# Patient Record
Sex: Female | Born: 1970 | Race: White | Hispanic: No | Marital: Married | Smoking: Never smoker
Health system: Southern US, Community
[De-identification: ages and names within clinical notes are randomized; demographics above are authoritative.]

## PROBLEM LIST (undated history)

## (undated) DIAGNOSIS — F329 Major depressive disorder, single episode, unspecified: Secondary | ICD-10-CM

## (undated) DIAGNOSIS — E079 Disorder of thyroid, unspecified: Secondary | ICD-10-CM

## (undated) DIAGNOSIS — E039 Hypothyroidism, unspecified: Secondary | ICD-10-CM

## (undated) DIAGNOSIS — I10 Essential (primary) hypertension: Secondary | ICD-10-CM

## (undated) DIAGNOSIS — E8881 Metabolic syndrome: Secondary | ICD-10-CM

## (undated) DIAGNOSIS — F419 Anxiety disorder, unspecified: Secondary | ICD-10-CM

## (undated) DIAGNOSIS — F32A Depression, unspecified: Secondary | ICD-10-CM

## (undated) HISTORY — DX: Metabolic syndrome: E88.810

## (undated) HISTORY — DX: Anxiety disorder, unspecified: F41.9

## (undated) HISTORY — DX: Metabolic syndrome: E88.81

## (undated) HISTORY — PX: WISDOM TOOTH EXTRACTION: SHX21

---

## 1999-11-03 ENCOUNTER — Other Ambulatory Visit: Admission: RE | Admit: 1999-11-03 | Discharge: 1999-11-03 | Payer: Self-pay | Admitting: Obstetrics and Gynecology

## 2000-11-25 ENCOUNTER — Emergency Department (HOSPITAL_COMMUNITY): Admission: EM | Admit: 2000-11-25 | Discharge: 2000-11-25 | Payer: Self-pay | Admitting: Emergency Medicine

## 2001-01-12 ENCOUNTER — Other Ambulatory Visit: Admission: RE | Admit: 2001-01-12 | Discharge: 2001-01-12 | Payer: Self-pay | Admitting: Obstetrics and Gynecology

## 2001-03-01 HISTORY — PX: NASAL SINUS SURGERY: SHX719

## 2001-06-16 ENCOUNTER — Encounter: Payer: Self-pay | Admitting: Otolaryngology

## 2001-06-16 ENCOUNTER — Ambulatory Visit (HOSPITAL_COMMUNITY): Admission: RE | Admit: 2001-06-16 | Discharge: 2001-06-16 | Payer: Self-pay | Admitting: Otolaryngology

## 2001-07-11 ENCOUNTER — Encounter: Payer: Self-pay | Admitting: Otolaryngology

## 2001-07-11 ENCOUNTER — Ambulatory Visit (HOSPITAL_COMMUNITY): Admission: RE | Admit: 2001-07-11 | Discharge: 2001-07-11 | Payer: Self-pay | Admitting: Otolaryngology

## 2001-08-01 ENCOUNTER — Ambulatory Visit (HOSPITAL_COMMUNITY): Admission: RE | Admit: 2001-08-01 | Discharge: 2001-08-01 | Payer: Self-pay | Admitting: Otolaryngology

## 2001-08-01 ENCOUNTER — Encounter: Payer: Self-pay | Admitting: Otolaryngology

## 2002-02-01 ENCOUNTER — Other Ambulatory Visit: Admission: RE | Admit: 2002-02-01 | Discharge: 2002-02-01 | Payer: Self-pay | Admitting: Obstetrics and Gynecology

## 2002-06-14 IMAGING — CT CT MAXILLOFACIAL W/O CM
1 series · 15 of 30 positions shown, 19 images · non-contrast
Comparison: none

FINDINGS
CLINICAL DATA: CHRONIC SINUSITIS.
CT MAXILLOFACIAL WITHOUT CONTRAST
THE PATIENT'S PRIOR EXAM WAS PERFORMED 07/11/01.
THERE IS PERSISTENT PROMINENT POLYPOID OPACIFICATION OF THE LEFT MAXILLARY SINUS, WHICH MAY
REPRESENT A RETENTION CYST VERSUS POLYP.  THE LOWER ASPECT OF THE INFUNDIBULUM OF THE LEFT
OSTEOMEATAL COMPLEX IS SLIGHTLY NARROWED BY A HALLER CELL.  THE LEFT-SIDED HALLER CELL IS PARTIALLY
OPACIFIED WITH A SMALL AIR-FLUID LEVEL.
MUCOSAL THICKENING AND NARROWING OF THE INFUNDIBULUM OF THE RIGHT OSTEOMEATAL COMPLEX.  MILD
MUCOSAL THICKENING, RIGHT MAXILLARY SINUS, MOST  NOTABLE ALONG THE MEDIAL AND INFERIOR WALL
MEASURING UP TO 4 MM IN MAXIMAL MUCOSAL THICKNESS.
NASAL SEPTUM IS MINIMALLY DEVIATED TO THE RIGHT.  THERE IS A MILD PARADOXICAL CONFIGURATION OF THE
RIGHT MIDDLE TURBINATE.
ASYMMETRIC SPHENOID SINUS AIR CELL WITH RIGHT SPHENOID SINUS AIR CELL DOMINANT.
HYPOPLASTIC LEFT FRONTAL SINUS.  FRONTAL SINUSES ARE CLEAR AS ARE THE MAJORITY OF THE ETHMOID SINUS
AIR CELLS WITH THE EXCEPTION OF A SINGLE LEFT MID ETHMOID SINUS AIR CELL AND THE ABOVE DESCRIBED
LEFT HALLER CELL.
IMPRESSION
POLYPOID OPACIFICATION LEFT MAXILLARY SINUS POSSIBLY REPRESENTING A RETENTION CYST VS. POLYP.
THERE IS NARROWING OF THE INFUNDIBULUM LEFT OSTEOMEATAL COMPLEX ALONG ITS LOWER ASPECT CAUSED BY
HALLER CELL.  LEFT-SIDED HALLER CELL IS PARTIALLY OPACIFIED WITH A SMALL AIR-FLUID LEVEL.
MUCOSAL THICKENING WITH SUBSEQUENT NARROWING OF THE INFUNDIBULUM AND THE RIGHT OSTEOMEATAL COMPLEX.
 MILD MUCOSAL THICKENING, RIGHT MAXILLARY SINUS, MOST NOTABLE MEDIALLY AND INFERIORLY MEASURING UP
TO 4 MM IN MAXIMAL THICKNESS.
ASYMMETRIC APPEARANCE OF THE SPHENOID SINUS AIR CELLS WITH THE RIGHT SPHENOID SINUS AIR CELL
DOMINANT.
MILD DEVIATION OF THE NASAL SEPTUM TO THE RIGHT WITH A PARADOXICAL CONFIGURATION OF THE RIGHT
MIDDLE TURBINATE.

[Series 6291: — · axial · 0.28mm/px · z∈[-574,-494]mm · 15 of 36 slices shown, 19 images]
[im 2/36  brain]
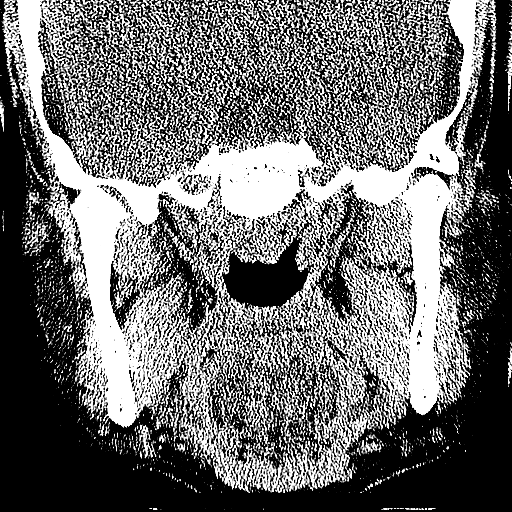
[im 2/36  bone]
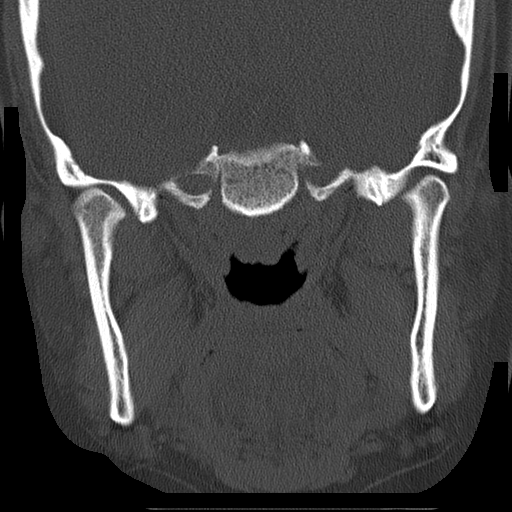
[im 4/36  bone]
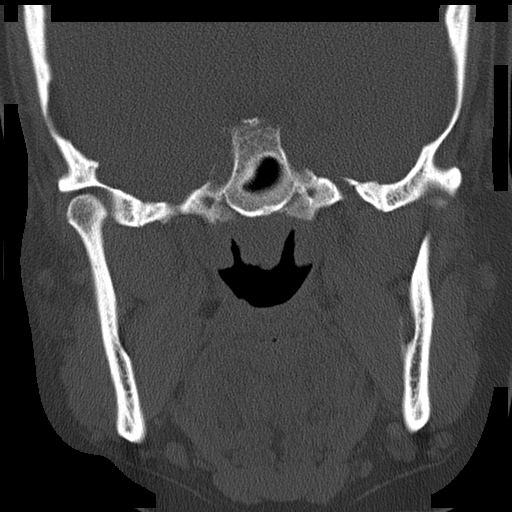
[im 7/36  bone]
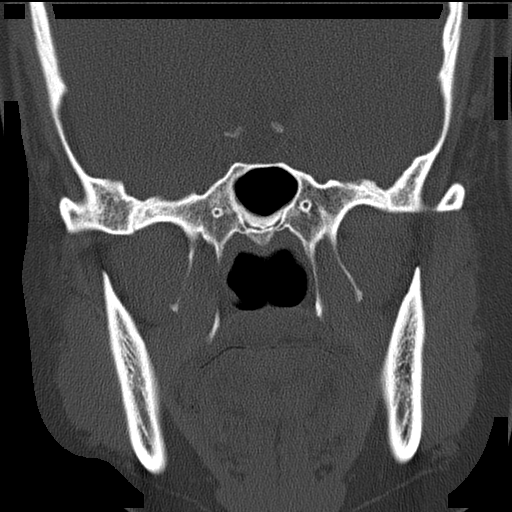
[im 9/36  bone]
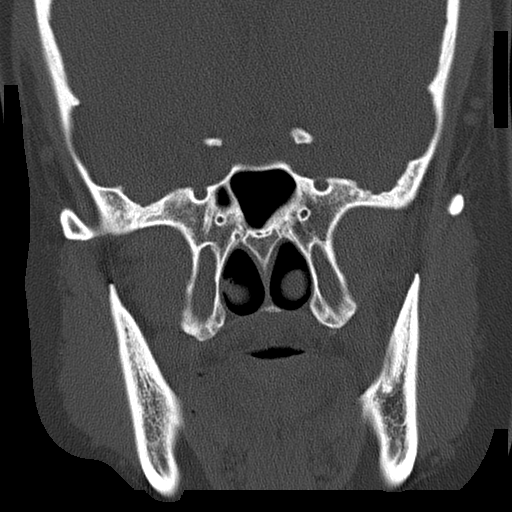
[im 11/36  brain]
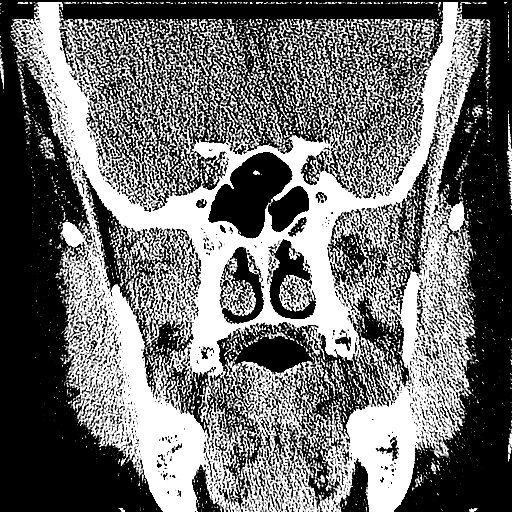
[im 11/36  bone]
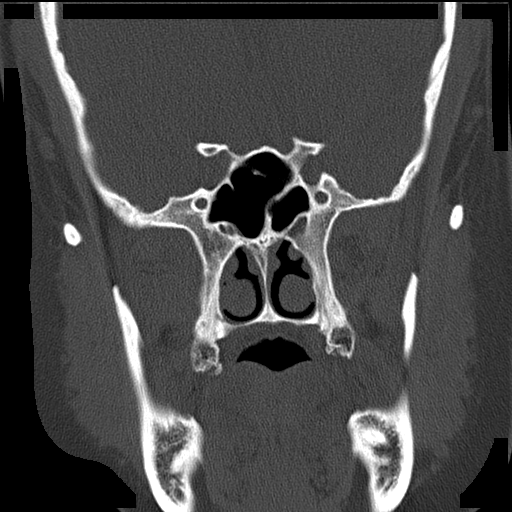
[im 14/36  bone]
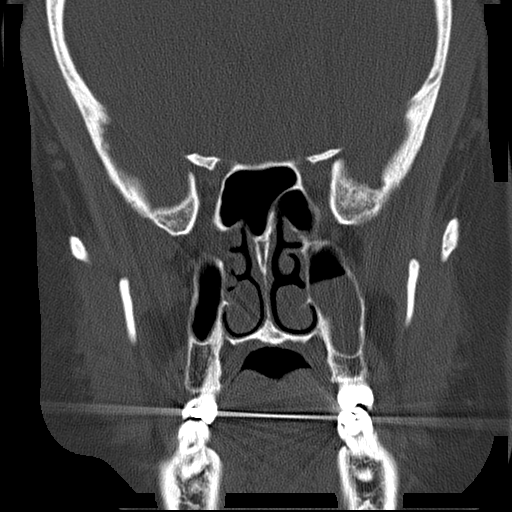
[im 16/36  bone]
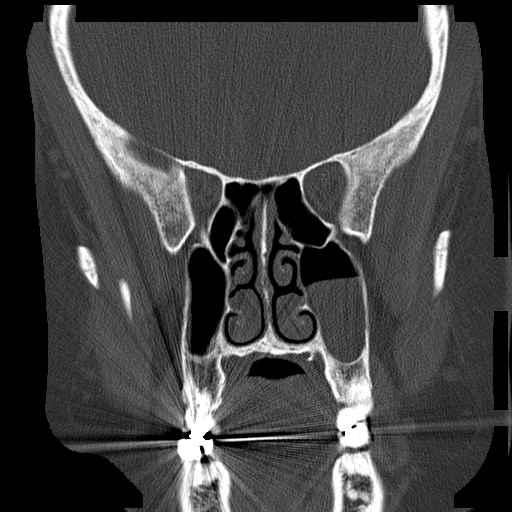
[im 19/36  bone]
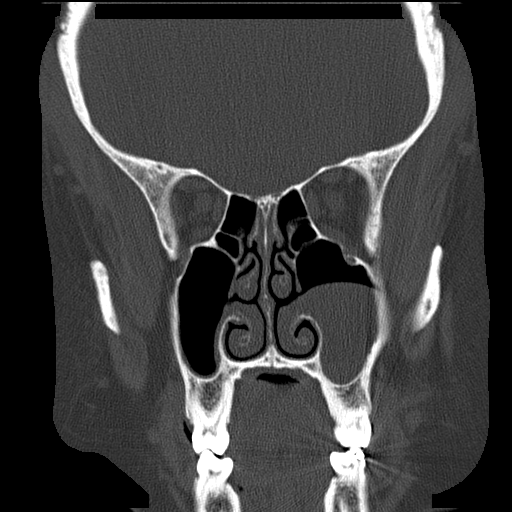
[im 20/36  brain]
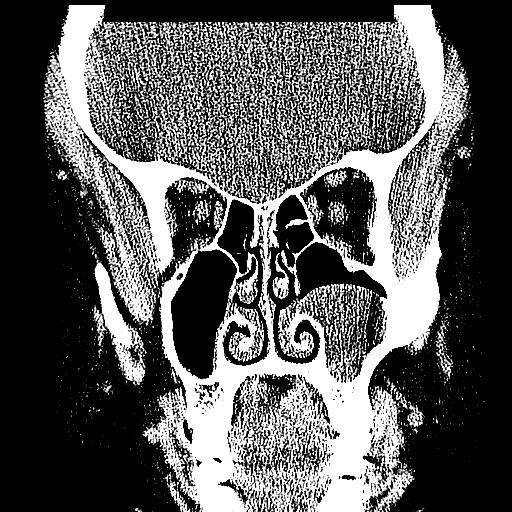
[im 20/36  bone]
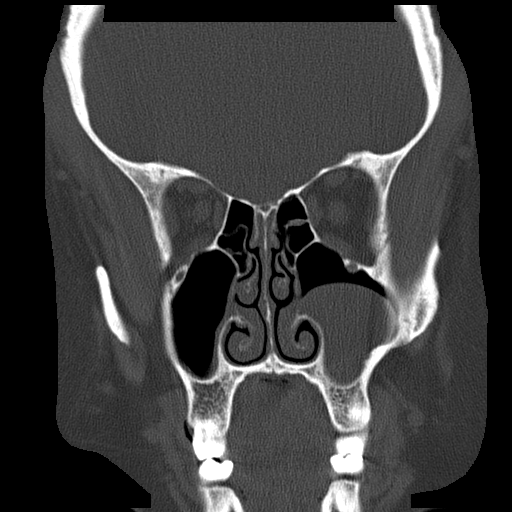
[im 22/36  bone]
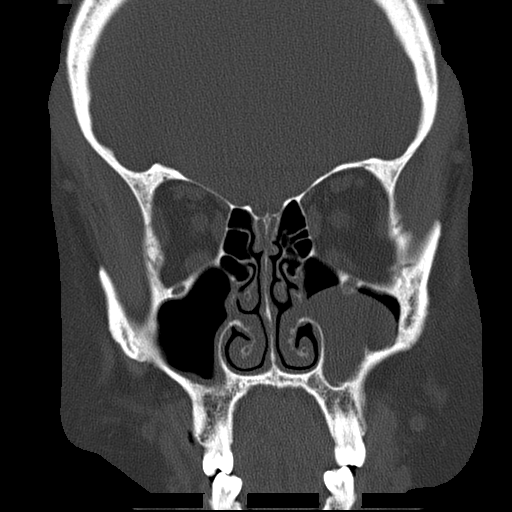
[im 25/36  bone]
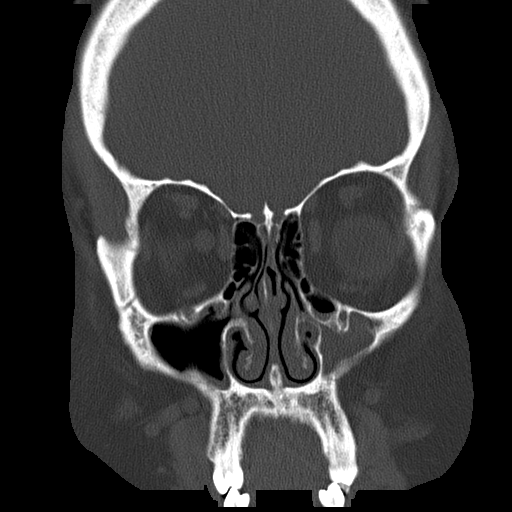
[im 27/36  bone]
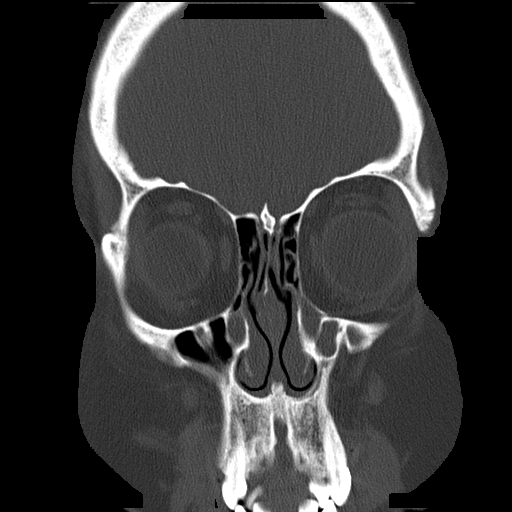
[im 29/36  brain]
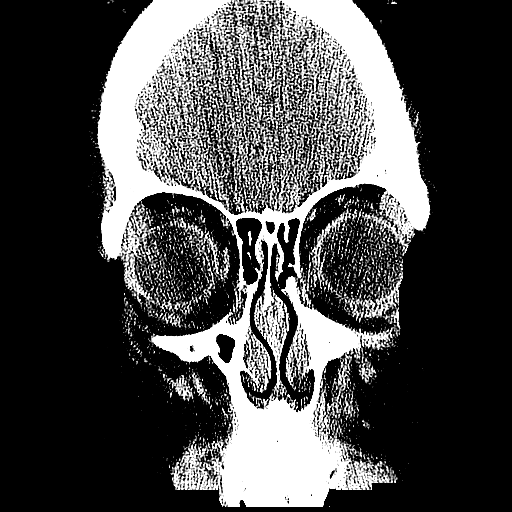
[im 29/36  bone]
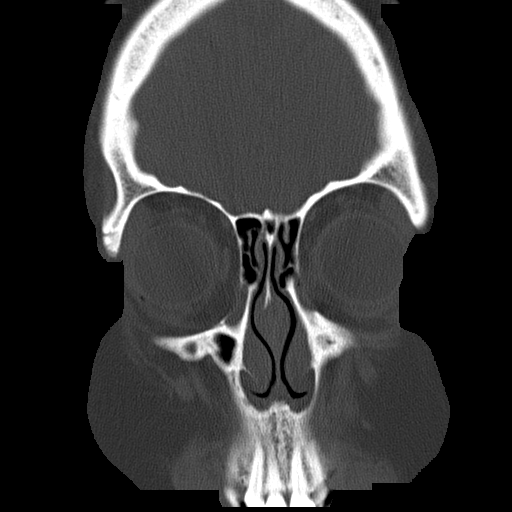
[im 32/36  bone]
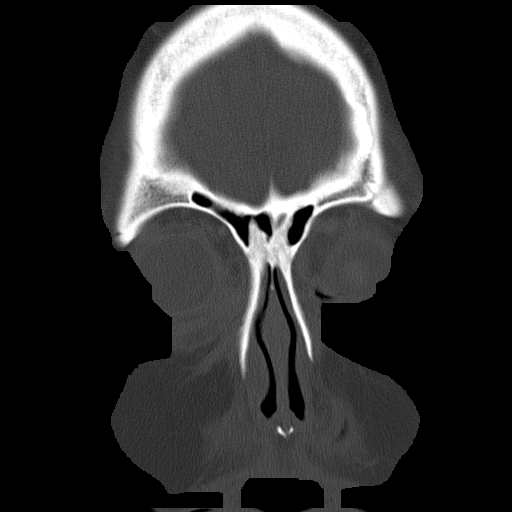
[im 34/36  bone]
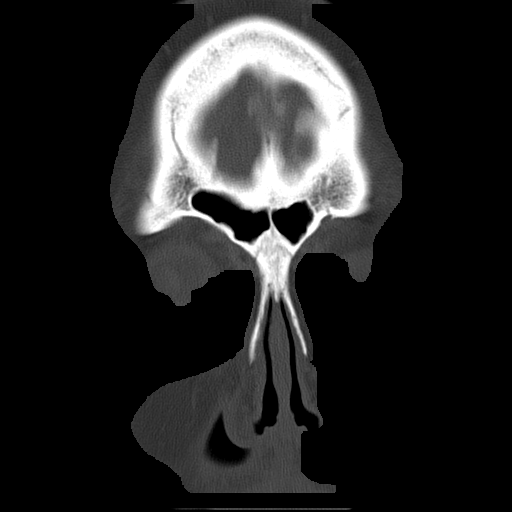

[15 of 30 positions shown; findings below may reference images not displayed]

## 2003-01-06 ENCOUNTER — Emergency Department (HOSPITAL_COMMUNITY): Admission: EM | Admit: 2003-01-06 | Discharge: 2003-01-06 | Payer: Self-pay | Admitting: Emergency Medicine

## 2003-02-13 ENCOUNTER — Other Ambulatory Visit: Admission: RE | Admit: 2003-02-13 | Discharge: 2003-02-13 | Payer: Self-pay | Admitting: Obstetrics and Gynecology

## 2004-03-30 ENCOUNTER — Other Ambulatory Visit: Admission: RE | Admit: 2004-03-30 | Discharge: 2004-03-30 | Payer: Self-pay | Admitting: Obstetrics and Gynecology

## 2004-05-05 ENCOUNTER — Ambulatory Visit (HOSPITAL_COMMUNITY): Admission: RE | Admit: 2004-05-05 | Discharge: 2004-05-05 | Payer: Self-pay | Admitting: Family Medicine

## 2005-04-27 ENCOUNTER — Other Ambulatory Visit: Admission: RE | Admit: 2005-04-27 | Discharge: 2005-04-27 | Payer: Self-pay | Admitting: Obstetrics and Gynecology

## 2006-03-31 ENCOUNTER — Inpatient Hospital Stay (HOSPITAL_COMMUNITY): Admission: AD | Admit: 2006-03-31 | Discharge: 2006-03-31 | Payer: Self-pay | Admitting: Obstetrics and Gynecology

## 2006-04-22 ENCOUNTER — Ambulatory Visit (HOSPITAL_COMMUNITY): Admission: RE | Admit: 2006-04-22 | Discharge: 2006-04-22 | Payer: Self-pay | Admitting: Obstetrics and Gynecology

## 2006-04-29 ENCOUNTER — Inpatient Hospital Stay (HOSPITAL_COMMUNITY): Admission: AD | Admit: 2006-04-29 | Discharge: 2006-04-29 | Payer: Self-pay | Admitting: Obstetrics and Gynecology

## 2007-06-23 ENCOUNTER — Inpatient Hospital Stay (HOSPITAL_COMMUNITY): Admission: AD | Admit: 2007-06-23 | Discharge: 2007-06-25 | Payer: Self-pay | Admitting: Obstetrics and Gynecology

## 2007-06-23 ENCOUNTER — Encounter (INDEPENDENT_AMBULATORY_CARE_PROVIDER_SITE_OTHER): Payer: Self-pay | Admitting: Obstetrics and Gynecology

## 2007-06-29 ENCOUNTER — Ambulatory Visit: Admission: RE | Admit: 2007-06-29 | Discharge: 2007-06-29 | Payer: Self-pay | Admitting: Obstetrics and Gynecology

## 2008-10-11 HISTORY — PX: US ECHOCARDIOGRAPHY: HXRAD669

## 2008-10-11 HISTORY — PX: NM MYOCAR PERF WALL MOTION: HXRAD629

## 2009-03-01 HISTORY — PX: DILATION AND CURETTAGE OF UTERUS: SHX78

## 2010-11-24 LAB — CBC
HCT: 27.7 — ABNORMAL LOW
HCT: 34.3 — ABNORMAL LOW
Hemoglobin: 12.3
Hemoglobin: 9.9 — ABNORMAL LOW
MCHC: 35.6
MCHC: 35.7
MCV: 87
MCV: 87.7
Platelets: 224
Platelets: 262
RBC: 3.16 — ABNORMAL LOW
RBC: 3.94
RDW: 14.7
RDW: 14.7
WBC: 12.3 — ABNORMAL HIGH
WBC: 9.2

## 2010-11-24 LAB — RPR: RPR Ser Ql: NONREACTIVE

## 2011-08-10 ENCOUNTER — Encounter (HOSPITAL_COMMUNITY): Payer: Self-pay

## 2011-08-10 ENCOUNTER — Emergency Department (HOSPITAL_COMMUNITY)
Admission: EM | Admit: 2011-08-10 | Discharge: 2011-08-10 | Disposition: A | Discharge status: Home / Self Care | Payer: No Typology Code available for payment source | Attending: Emergency Medicine | Admitting: Emergency Medicine

## 2011-08-10 DIAGNOSIS — Z79899 Other long term (current) drug therapy: Secondary | ICD-10-CM | POA: Insufficient documentation

## 2011-08-10 DIAGNOSIS — S161XXA Strain of muscle, fascia and tendon at neck level, initial encounter: Secondary | ICD-10-CM

## 2011-08-10 DIAGNOSIS — E079 Disorder of thyroid, unspecified: Secondary | ICD-10-CM | POA: Insufficient documentation

## 2011-08-10 DIAGNOSIS — M542 Cervicalgia: Secondary | ICD-10-CM | POA: Insufficient documentation

## 2011-08-10 DIAGNOSIS — Z7982 Long term (current) use of aspirin: Secondary | ICD-10-CM | POA: Insufficient documentation

## 2011-08-10 DIAGNOSIS — I10 Essential (primary) hypertension: Secondary | ICD-10-CM | POA: Insufficient documentation

## 2011-08-10 DIAGNOSIS — M546 Pain in thoracic spine: Secondary | ICD-10-CM | POA: Insufficient documentation

## 2011-08-10 HISTORY — DX: Essential (primary) hypertension: I10

## 2011-08-10 HISTORY — DX: Disorder of thyroid, unspecified: E07.9

## 2011-08-10 MED ORDER — METHOCARBAMOL 500 MG PO TABS: ORAL_TABLET | ORAL | Status: AC

## 2011-08-10 MED ORDER — IBUPROFEN 200 MG PO TABS
600.0000 mg | ORAL_TABLET | Freq: Once | ORAL | Status: AC
  Administered 2011-08-10: 600 mg via ORAL
  Filled 2011-08-10: qty 3

## 2011-08-10 MED ORDER — IBUPROFEN 600 MG PO TABS: 600.0000 mg | ORAL_TABLET | Freq: Four times a day (QID) | ORAL | Status: AC | PRN
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
## 2011-09-05 ENCOUNTER — Encounter (HOSPITAL_COMMUNITY): Payer: Self-pay | Admitting: Pharmacist

## 2011-09-06 NOTE — H&P (Signed)
Brandi Young  DICTATION # [id number] CSN# [id number]   Meriel Pica, MD 09/06/2011 9:48 AM

## 2011-09-06 NOTE — H&P (Signed)
[name], Brandi Young                ACCOUNT NO.:  [id number]  MEDICAL RECORD NO.:  [id number]  LOCATION:                                 FACILITY:  PHYSICIAN:  Duke Salvia. Marcelle Overlie, M.D.DATE OF BIRTH:  1970-03-13  DATE OF ADMISSION:  09/16/2011 DATE OF DISCHARGE:                             HISTORY & PHYSICAL   CHIEF COMPLAINT:  Dyspareunia, pelvic pain, symptomatic uterine prolapse.  HISTORY OF PRESENT ILLNESS:  A 41 year old, G1, P1, who has a 1-year history of increasing pelvic pressure, the sensation that something is falling out and discomfort with intercourse.  On recent examination, it was noted to have moderate uterine prolapse.  There was no significant cystocele or rectocele noted.  The remainder of her exam was unremarkable.  Last GYN ultrasound in November 2012.  She had a normal size uterus.  Adnexa unremarkable.  She presents at this time for LAVH. This procedure including risks related to bleeding, infection, adjacent organ injury, the possible need to complete the surgery by open technique along with her expected recovery time reviewed, which she understands and accepts.  ALLERGIES:  None.  CURRENT MEDICATIONS:  Norvasc, Toprol-XL, Synthroid, Cymbalta, metformin, baby aspirin daily.  OBSTETRICAL HISTORY:  One vaginal delivery in 2009.  She did have a D and C in 2011 to evaluate abnormal bleeding.  REVIEW OF SYSTEMS:  Significant for history of headache, thyroid disease, and hypertension.  FAMILY HISTORY:  Significant for heart disease, IBS, thyroid disease, kidney disease, diabetes, and hypertension.  SOCIAL HISTORY:  Denies alcohol, tobacco, or drug use.  Dr. Regino Schultze is her medical doctor.  PHYSICAL EXAMINATION:  VITAL SIGNS:  Temperature 98.2, blood pressure 132/90. HEENT:  Unremarkable. NECK:  Supple without masses. LUNGS:  Clear. CARDIOVASCULAR:  Regular rate and rhythm without murmurs, rubs, or gallops.  BREASTS:  Without masses. ABDOMEN:  Soft,  flat, nontender. PELVIC:  Normal external genitalia.  Vagina and cervix revealed the cervix halfway down the vagina, consistent with uterine prolapse.  Her anterior and posterior support was reasonably normal.  Uterus normal size.  Adnexa negative. EXTREMITIES:  Unremarkable. NEUROLOGIC:  Unremarkable.  IMPRESSION:  Symptomatic uterine prolapse.  PLAN:  LAVH.  Procedure and risks discussed as above.     Brandi Young M. Marcelle Overlie, M.D.     [facility]MED[facility]  D:  09/06/2011  T:  09/06/2011  Job:  [id number]

## 2011-09-08 ENCOUNTER — Encounter (HOSPITAL_COMMUNITY)
Admission: RE | Admit: 2011-09-08 | Discharge: 2011-09-08 | Disposition: A | Discharge status: Home / Self Care | Payer: BC Managed Care – PPO | Source: Ambulatory Visit | Attending: Obstetrics and Gynecology | Admitting: Obstetrics and Gynecology

## 2011-09-08 ENCOUNTER — Encounter (HOSPITAL_COMMUNITY): Payer: Self-pay

## 2011-09-08 HISTORY — DX: Major depressive disorder, single episode, unspecified: F32.9

## 2011-09-08 HISTORY — DX: Depression, unspecified: F32.A

## 2011-09-08 LAB — CBC
HCT: 38.5 % (ref 36.0–46.0)
Hemoglobin: 13.2 g/dL (ref 12.0–15.0)
MCH: 28.9 pg (ref 26.0–34.0)
MCHC: 34.3 g/dL (ref 30.0–36.0)
MCV: 84.4 fL (ref 78.0–100.0)
Platelets: 282 10*3/uL (ref 150–400)
RBC: 4.56 MIL/uL (ref 3.87–5.11)
RDW: 13.6 % (ref 11.5–15.5)
WBC: 7.6 10*3/uL (ref 4.0–10.5)

## 2011-09-08 LAB — BASIC METABOLIC PANEL
BUN: 13 mg/dL (ref 6–23)
CO2: 29 mEq/L (ref 19–32)
Calcium: 9.9 mg/dL (ref 8.4–10.5)
Chloride: 102 mEq/L (ref 96–112)
Creatinine, Ser: 0.68 mg/dL (ref 0.50–1.10)
GFR calc Af Amer: >90 mL/min (ref >90)
GFR calc non Af Amer: >90 mL/min (ref >90)
Glucose, Bld: 85 mg/dL (ref 70–99)
Potassium: 4 mEq/L (ref 3.5–5.1)
Sodium: 139 mEq/L (ref 135–145)

## 2011-09-08 LAB — SURGICAL PCR SCREEN
MRSA, PCR: INVALID — AB
Staphylococcus aureus: INVALID — AB

## 2011-09-08 NOTE — Patient Instructions (Signed)
  Your procedure is scheduled on: Thursday, July 18  Enter through the [employer] [facility] at: 6:00am Pick up the phone at the desk and dial [phone] and inform us of your arrival.  Please call this number if you have any problems the morning of surgery: [phone]  Remember: Do not eat food after midnight: Wednesday Do not drink clear liquids after: Wednesday Take these medicines the morning of surgery with a SIP OF WATER:  Norvasc Cymbalta Synthroid Toprol   Do not wear jewelry, make-up, or FINGER nail polish No metal in your hair or on your body. Do not wear lotions, powders, perfumes or deodorant. Do not shave 48 hours prior to surgery. Do not bring valuables to the hospital. Contacts, dentures or bridgework may not be worn into surgery.  Leave suitcase in the car. After Surgery it may be brought to your room. For patients being admitted to the hospital, checkout time is 11:00am the day of discharge.  Remember to use your hibiclens as instructed.Please shower with 1/2 bottle the evening before your surgery and the other 1/2 bottle the morning of surgery. Neck down avoiding private area.

## 2011-09-08 NOTE — Pre-Procedure Instructions (Signed)
EKG new and old shown to Dr. Malen Gauze.  No new orders received.

## 2011-09-10 LAB — MRSA CULTURE

## 2011-09-16 ENCOUNTER — Ambulatory Visit (HOSPITAL_COMMUNITY)
Admission: RE | Admit: 2011-09-16 | Discharge: 2011-09-17 | Disposition: A | Discharge status: Home / Self Care | Payer: BC Managed Care – PPO | Source: Ambulatory Visit | Attending: Obstetrics and Gynecology | Admitting: Obstetrics and Gynecology

## 2011-09-16 ENCOUNTER — Encounter (HOSPITAL_COMMUNITY): Payer: Self-pay | Admitting: Anesthesiology

## 2011-09-16 ENCOUNTER — Encounter (HOSPITAL_COMMUNITY): Payer: Self-pay | Admitting: *Deleted

## 2011-09-16 ENCOUNTER — Ambulatory Visit (HOSPITAL_COMMUNITY): Payer: BC Managed Care – PPO | Admitting: Anesthesiology

## 2011-09-16 ENCOUNTER — Encounter (HOSPITAL_COMMUNITY)
Admission: RE | Disposition: A | Discharge status: Home / Self Care | Payer: Self-pay | Source: Ambulatory Visit | Attending: Obstetrics and Gynecology

## 2011-09-16 DIAGNOSIS — Z5331 Laparoscopic surgical procedure converted to open procedure: Secondary | ICD-10-CM | POA: Insufficient documentation

## 2011-09-16 DIAGNOSIS — N814 Uterovaginal prolapse, unspecified: Secondary | ICD-10-CM | POA: Insufficient documentation

## 2011-09-16 DIAGNOSIS — N949 Unspecified condition associated with female genital organs and menstrual cycle: Secondary | ICD-10-CM | POA: Insufficient documentation

## 2011-09-16 DIAGNOSIS — IMO0002 Reserved for concepts with insufficient information to code with codable children: Secondary | ICD-10-CM | POA: Insufficient documentation

## 2011-09-16 HISTORY — DX: Hypothyroidism, unspecified: E03.9

## 2011-09-16 HISTORY — PX: VAGINAL HYSTERECTOMY: SHX2639

## 2011-09-16 LAB — URINALYSIS, ROUTINE W REFLEX MICROSCOPIC
Bilirubin Urine: NEGATIVE
Glucose, UA: NEGATIVE mg/dL
Ketones, ur: NEGATIVE mg/dL
Leukocytes, UA: NEGATIVE
Nitrite: NEGATIVE
Protein, ur: NEGATIVE mg/dL
Specific Gravity, Urine: 1.02 (ref 1.005–1.030)
Urobilinogen, UA: 0.2 mg/dL (ref 0.0–1.0)
pH: 6.5 (ref 5.0–8.0)

## 2011-09-16 LAB — URINE MICROSCOPIC-ADD ON

## 2011-09-16 LAB — PREGNANCY, URINE: Preg Test, Ur: NEGATIVE

## 2011-09-16 SURGERY — HYSTERECTOMY, VAGINAL
Anesthesia: General | Site: Vagina | Wound class: Clean Contaminated

## 2011-09-16 MED ORDER — DIPHENHYDRAMINE HCL 12.5 MG/5ML PO ELIX: 12.5000 mg | ORAL_SOLUTION | Freq: Four times a day (QID) | ORAL | Status: DC | PRN

## 2011-09-16 MED ORDER — BUTORPHANOL TARTRATE 1 MG/ML IJ SOLN: 1.0000 mg | INTRAMUSCULAR | Status: DC | PRN

## 2011-09-16 MED ORDER — NEOSTIGMINE METHYLSULFATE 1 MG/ML IJ SOLN
INTRAMUSCULAR | Status: DC | PRN
  Administered 2011-09-16: 3 mg via INTRAVENOUS

## 2011-09-16 MED ORDER — ONDANSETRON HCL 4 MG/2ML IJ SOLN
INTRAMUSCULAR | Status: AC
  Filled 2011-09-16: qty 2

## 2011-09-16 MED ORDER — MIDAZOLAM HCL 2 MG/2ML IJ SOLN
INTRAMUSCULAR | Status: AC
  Filled 2011-09-16: qty 2

## 2011-09-16 MED ORDER — LIDOCAINE HCL (CARDIAC) 20 MG/ML IV SOLN
INTRAVENOUS | Status: DC | PRN
  Administered 2011-09-16: 50 mg via INTRAVENOUS

## 2011-09-16 MED ORDER — KETOROLAC TROMETHAMINE 30 MG/ML IJ SOLN: 15.0000 mg | Freq: Once | INTRAMUSCULAR | Status: DC | PRN

## 2011-09-16 MED ORDER — GLYCOPYRROLATE 0.2 MG/ML IJ SOLN
INTRAMUSCULAR | Status: DC | PRN
  Administered 2011-09-16: 0.1 mg via INTRAVENOUS
  Administered 2011-09-16: 0.6 mg via INTRAVENOUS

## 2011-09-16 MED ORDER — FENTANYL CITRATE 0.05 MG/ML IJ SOLN
INTRAMUSCULAR | Status: AC
  Filled 2011-09-16: qty 5

## 2011-09-16 MED ORDER — ONDANSETRON HCL 4 MG PO TABS: 4.0000 mg | ORAL_TABLET | Freq: Four times a day (QID) | ORAL | Status: DC | PRN

## 2011-09-16 MED ORDER — ONDANSETRON HCL 4 MG/2ML IJ SOLN: 4.0000 mg | Freq: Four times a day (QID) | INTRAMUSCULAR | Status: DC | PRN

## 2011-09-16 MED ORDER — METOPROLOL SUCCINATE ER 100 MG PO TB24
100.0000 mg | ORAL_TABLET | Freq: Every day | ORAL | Status: DC
  Administered 2011-09-17: 100 mg via ORAL
  Filled 2011-09-16: qty 1

## 2011-09-16 MED ORDER — DIPHENHYDRAMINE HCL 12.5 MG/5ML PO ELIX
12.5000 mg | ORAL_SOLUTION | Freq: Four times a day (QID) | ORAL | Status: DC | PRN
  Filled 2011-09-16: qty 5

## 2011-09-16 MED ORDER — ZOLPIDEM TARTRATE 5 MG PO TABS: 5.0000 mg | ORAL_TABLET | Freq: Every evening | ORAL | Status: DC | PRN

## 2011-09-16 MED ORDER — KETOROLAC TROMETHAMINE 30 MG/ML IJ SOLN
INTRAMUSCULAR | Status: DC | PRN
  Administered 2011-09-16: 30 mg via INTRAVENOUS

## 2011-09-16 MED ORDER — SODIUM CHLORIDE 0.9 % IJ SOLN
INTRAMUSCULAR | Status: DC | PRN
  Administered 2011-09-16: 10 mL

## 2011-09-16 MED ORDER — SODIUM CHLORIDE 0.9 % IJ SOLN: 9.0000 mL | INTRAMUSCULAR | Status: DC | PRN

## 2011-09-16 MED ORDER — MIDAZOLAM HCL 5 MG/5ML IJ SOLN
INTRAMUSCULAR | Status: DC | PRN
  Administered 2011-09-16: 2 mg via INTRAVENOUS

## 2011-09-16 MED ORDER — PROPOFOL 10 MG/ML IV EMUL
INTRAVENOUS | Status: AC
Start: 2011-09-16 — End: 2011-09-16
  Filled 2011-09-16: qty 20

## 2011-09-16 MED ORDER — OXYCODONE-ACETAMINOPHEN 5-325 MG PO TABS: 1.0000 | ORAL_TABLET | ORAL | Status: DC | PRN

## 2011-09-16 MED ORDER — LEVOTHYROXINE SODIUM 175 MCG PO TABS
175.0000 ug | ORAL_TABLET | Freq: Every day | ORAL | Status: DC
  Administered 2011-09-17: 175 ug via ORAL
  Filled 2011-09-16: qty 1

## 2011-09-16 MED ORDER — DIPHENHYDRAMINE HCL 50 MG/ML IJ SOLN: 12.5000 mg | Freq: Four times a day (QID) | INTRAMUSCULAR | Status: DC | PRN

## 2011-09-16 MED ORDER — FENTANYL CITRATE 0.05 MG/ML IJ SOLN
INTRAMUSCULAR | Status: DC | PRN
  Administered 2011-09-16: 50 ug via INTRAVENOUS
  Administered 2011-09-16: 100 ug via INTRAVENOUS
  Administered 2011-09-16: 150 ug via INTRAVENOUS

## 2011-09-16 MED ORDER — DEXAMETHASONE SODIUM PHOSPHATE 10 MG/ML IJ SOLN
INTRAMUSCULAR | Status: AC
  Filled 2011-09-16: qty 1

## 2011-09-16 MED ORDER — AMLODIPINE BESYLATE 2.5 MG PO TABS
2.5000 mg | ORAL_TABLET | Freq: Every day | ORAL | Status: DC
  Administered 2011-09-17: 09:00:00 via ORAL
  Filled 2011-09-16: qty 1

## 2011-09-16 MED ORDER — LEVOTHYROXINE SODIUM 150 MCG PO TABS: 150.0000 ug | ORAL_TABLET | Freq: Every day | ORAL | Status: DC

## 2011-09-16 MED ORDER — 0.9 % SODIUM CHLORIDE (POUR BTL) OPTIME
TOPICAL | Status: DC | PRN
  Administered 2011-09-16: 1000 mL

## 2011-09-16 MED ORDER — MORPHINE SULFATE (PF) 1 MG/ML IV SOLN
INTRAVENOUS | Status: DC
  Administered 2011-09-16: 6.5 mg via INTRAVENOUS
  Administered 2011-09-16: 9 mg via INTRAVENOUS

## 2011-09-16 MED ORDER — IBUPROFEN 800 MG PO TABS
800.0000 mg | ORAL_TABLET | Freq: Three times a day (TID) | ORAL | Status: DC | PRN
  Administered 2011-09-17: 800 mg via ORAL
  Filled 2011-09-16: qty 1

## 2011-09-16 MED ORDER — BUPIVACAINE HCL (PF) 0.25 % IJ SOLN
INTRAMUSCULAR | Status: DC | PRN
  Administered 2011-09-16: 7 mL

## 2011-09-16 MED ORDER — HYDROMORPHONE HCL PF 1 MG/ML IJ SOLN
INTRAMUSCULAR | Status: AC
  Administered 2011-09-16: 0.5 mg via INTRAVENOUS
  Filled 2011-09-16: qty 1

## 2011-09-16 MED ORDER — HYDROMORPHONE HCL PF 1 MG/ML IJ SOLN
0.2500 mg | INTRAMUSCULAR | Status: DC | PRN
  Administered 2011-09-16 (×3): 0.5 mg via INTRAVENOUS

## 2011-09-16 MED ORDER — MORPHINE SULFATE (PF) 1 MG/ML IV SOLN: INTRAVENOUS | Status: DC

## 2011-09-16 MED ORDER — KETOROLAC TROMETHAMINE 30 MG/ML IJ SOLN
INTRAMUSCULAR | Status: AC
Start: 2011-09-16 — End: 2011-09-16
  Filled 2011-09-16: qty 1

## 2011-09-16 MED ORDER — MEPERIDINE HCL 25 MG/ML IJ SOLN: 6.2500 mg | INTRAMUSCULAR | Status: DC | PRN

## 2011-09-16 MED ORDER — GLYCOPYRROLATE 0.2 MG/ML IJ SOLN
INTRAMUSCULAR | Status: AC
  Filled 2011-09-16: qty 2

## 2011-09-16 MED ORDER — ROCURONIUM BROMIDE 50 MG/5ML IV SOLN
INTRAVENOUS | Status: AC
  Filled 2011-09-16: qty 1

## 2011-09-16 MED ORDER — DEXAMETHASONE SODIUM PHOSPHATE 10 MG/ML IJ SOLN
INTRAMUSCULAR | Status: DC | PRN
  Administered 2011-09-16: 10 mg via INTRAVENOUS

## 2011-09-16 MED ORDER — MENTHOL 3 MG MT LOZG
1.0000 | LOZENGE | OROMUCOSAL | Status: DC | PRN
  Filled 2011-09-16: qty 9

## 2011-09-16 MED ORDER — NALOXONE HCL 0.4 MG/ML IJ SOLN: 0.4000 mg | INTRAMUSCULAR | Status: DC | PRN

## 2011-09-16 MED ORDER — KETOROLAC TROMETHAMINE 30 MG/ML IJ SOLN
30.0000 mg | Freq: Once | INTRAMUSCULAR | Status: DC
Start: 2011-09-16 — End: 2011-09-16

## 2011-09-16 MED ORDER — METFORMIN HCL ER 500 MG PO TB24
1000.0000 mg | ORAL_TABLET | Freq: Every day | ORAL | Status: DC
  Administered 2011-09-17: 1000 mg via ORAL
  Filled 2011-09-16: qty 2

## 2011-09-16 MED ORDER — KETOROLAC TROMETHAMINE 30 MG/ML IJ SOLN: 30.0000 mg | Freq: Four times a day (QID) | INTRAMUSCULAR | Status: DC

## 2011-09-16 MED ORDER — NEOSTIGMINE METHYLSULFATE 1 MG/ML IJ SOLN
INTRAMUSCULAR | Status: AC
  Filled 2011-09-16: qty 10

## 2011-09-16 MED ORDER — LIDOCAINE HCL (CARDIAC) 20 MG/ML IV SOLN
INTRAVENOUS | Status: AC
  Filled 2011-09-16: qty 5

## 2011-09-16 MED ORDER — FENTANYL CITRATE 0.05 MG/ML IJ SOLN
INTRAMUSCULAR | Status: AC
  Filled 2011-09-16: qty 2

## 2011-09-16 MED ORDER — PROPOFOL 10 MG/ML IV EMUL
INTRAVENOUS | Status: DC | PRN
  Administered 2011-09-16: 160 mg via INTRAVENOUS

## 2011-09-16 MED ORDER — BUPIVACAINE HCL (PF) 0.25 % IJ SOLN
INTRAMUSCULAR | Status: AC
Start: 2011-09-16 — End: 2011-09-16
  Filled 2011-09-16: qty 30

## 2011-09-16 MED ORDER — LACTATED RINGERS IV SOLN
INTRAVENOUS | Status: DC
  Administered 2011-09-16: 17:00:00 via INTRAVENOUS

## 2011-09-16 MED ORDER — PROMETHAZINE HCL 25 MG/ML IJ SOLN: 6.2500 mg | INTRAMUSCULAR | Status: DC | PRN

## 2011-09-16 MED ORDER — MORPHINE SULFATE (PF) 1 MG/ML IV SOLN
INTRAVENOUS | Status: DC
  Administered 2011-09-16: 10:00:00 via INTRAVENOUS
  Filled 2011-09-16: qty 25

## 2011-09-16 MED ORDER — CEFAZOLIN SODIUM-DEXTROSE 2-3 GM-% IV SOLR
INTRAVENOUS | Status: AC
  Administered 2011-09-16: 2 g via INTRAVENOUS
  Filled 2011-09-16: qty 50

## 2011-09-16 MED ORDER — ROCURONIUM BROMIDE 100 MG/10ML IV SOLN
INTRAVENOUS | Status: DC | PRN
  Administered 2011-09-16: 50 mg via INTRAVENOUS

## 2011-09-16 MED ORDER — CEFAZOLIN SODIUM-DEXTROSE 2-3 GM-% IV SOLR: 2.0000 g | INTRAVENOUS | Status: DC

## 2011-09-16 MED ORDER — ONDANSETRON HCL 4 MG/2ML IJ SOLN
INTRAMUSCULAR | Status: DC | PRN
  Administered 2011-09-16: 4 mg via INTRAVENOUS

## 2011-09-16 MED ORDER — LACTATED RINGERS IV SOLN
INTRAVENOUS | Status: DC
  Administered 2011-09-16: 100 mL/h via INTRAVENOUS
  Administered 2011-09-16: 08:00:00 via INTRAVENOUS

## 2011-09-16 MED ORDER — DULOXETINE HCL 30 MG PO CPEP
30.0000 mg | ORAL_CAPSULE | Freq: Every day | ORAL | Status: DC
  Administered 2011-09-17: 30 mg via ORAL
  Filled 2011-09-16: qty 1

## 2011-09-16 MED ORDER — KETOROLAC TROMETHAMINE 30 MG/ML IJ SOLN
30.0000 mg | Freq: Four times a day (QID) | INTRAMUSCULAR | Status: DC
  Administered 2011-09-16 – 2011-09-17 (×3): 30 mg via INTRAVENOUS
  Filled 2011-09-16 (×3): qty 1

## 2011-09-16 SURGICAL SUPPLY — 37 items
CABLE HIGH FREQUENCY MONO STRZ (ELECTRODE) IMPLANT
CATH ROBINSON RED A/P 16FR (CATHETERS) ×3 IMPLANT
CLOTH BEACON ORANGE TIMEOUT ST (SAFETY) ×3 IMPLANT
CONT PATH 16OZ SNAP LID 3702 (MISCELLANEOUS) ×3 IMPLANT
COVER TABLE BACK 60X90 (DRAPES) ×3 IMPLANT
DECANTER SPIKE VIAL GLASS SM (MISCELLANEOUS) ×6 IMPLANT
DERMABOND ADVANCED (GAUZE/BANDAGES/DRESSINGS) ×1
DERMABOND ADVANCED .7 DNX12 (GAUZE/BANDAGES/DRESSINGS) ×2 IMPLANT
DRSG COVADERM PLUS 2X2 (GAUZE/BANDAGES/DRESSINGS) ×3 IMPLANT
ELECT LIGASURE LONG (ELECTRODE) IMPLANT
ELECT LIGASURE SHORT 9 REUSE (ELECTRODE) ×3 IMPLANT
ELECT REM PT RETURN 9FT ADLT (ELECTROSURGICAL) ×3
ELECTRODE REM PT RTRN 9FT ADLT (ELECTROSURGICAL) ×2 IMPLANT
GLOVE BIO SURGEON STRL SZ7 (GLOVE) ×9 IMPLANT
GLOVE BIOGEL PI IND STRL 6.5 (GLOVE) ×2 IMPLANT
GLOVE BIOGEL PI INDICATOR 6.5 (GLOVE) ×1
GOWN PREVENTION PLUS LG XLONG (DISPOSABLE) ×15 IMPLANT
NEEDLE INSUFFLATION 14GA 120MM (NEEDLE) ×3 IMPLANT
NEEDLE INSUFFLATION 14GA 150MM (NEEDLE) ×3 IMPLANT
NS IRRIG 1000ML POUR BTL (IV SOLUTION) ×3 IMPLANT
PACK LAVH (CUSTOM PROCEDURE TRAY) ×3 IMPLANT
PROTECTOR NERVE ULNAR (MISCELLANEOUS) ×3 IMPLANT
SEALER TISSUE G2 CVD JAW 45CM (ENDOMECHANICALS) IMPLANT
SET IRRIG TUBING LAPAROSCOPIC (IRRIGATION / IRRIGATOR) IMPLANT
SOLUTION ELECTROLUBE (MISCELLANEOUS) IMPLANT
STRIP CLOSURE SKIN 1/4X3 (GAUZE/BANDAGES/DRESSINGS) IMPLANT
SUT MON AB 2-0 CT1 36 (SUTURE) ×6 IMPLANT
SUT VIC AB 0 CT1 18XCR BRD8 (SUTURE) ×2 IMPLANT
SUT VIC AB 0 CT1 8-18 (SUTURE) ×1
SUT VICRYL 0 TIES 12 18 (SUTURE) ×3 IMPLANT
SUT VICRYL 4-0 PS2 18IN ABS (SUTURE) ×3 IMPLANT
TOWEL OR 17X24 6PK STRL BLUE (TOWEL DISPOSABLE) ×6 IMPLANT
TRAY FOLEY CATH 14FR (SET/KITS/TRAYS/PACK) ×3 IMPLANT
TROCAR Z-THREAD BLADED 11X100M (TROCAR) ×3 IMPLANT
TROCAR Z-THREAD BLADED 5X100MM (TROCAR) ×6 IMPLANT
WARMER LAPAROSCOPE (MISCELLANEOUS) ×3 IMPLANT
WATER STERILE IRR 1000ML POUR (IV SOLUTION) IMPLANT

## 2011-09-16 NOTE — Transfer of Care (Signed)
Immediate Anesthesia Transfer of Care Note  Patient: Brandi Young  Procedure(s) Performed: Procedure(s) (LRB): HYSTERECTOMY VAGINAL (N/A)  Patient Location: PACU  Anesthesia Type: General  Level of Consciousness: sedated  Airway & Oxygen Therapy: Patient Spontanous Breathing and Patient connected to nasal cannula oxygen  Post-op Assessment: Report given to PACU RN and Post -op Vital signs reviewed and stable  Post vital signs: stable  Complications: No apparent anesthesia complications

## 2011-09-16 NOTE — Progress Notes (Signed)
The patient was re-examined with no change in status 

## 2011-09-16 NOTE — Op Note (Signed)
Preoperative diagnosis: Pelvic pain, dyspareunia, uterine prolapse  Postoperative diagnosis: Same  Procedure: Attempted LAVH, TVH  Surgeon: Marcelle Overlie  Assistant: Low  EBL: 50 cc  Complications: None  Specimens removed: Uterus, to pathology  Procedure and findings:  The patient taken the operating room after an adequate level of general anesthesia was obtained the patient's legs in stirrups the abdomen perineum and vagina were prepped and draped in usual fashion for LAVH the cervix was noted to be at the introitus, EUA carried out uterus mid position normal size adnexa negative. Hulka tenaculum was positioned after draining the bladder appropriate timeout checks were taken at that point.  Subumbilical area was infiltrated with quarter percent Marcaine plain small incision was made she had significant panniculus first attempt pass with normal size varies needle without success, a long very seal was then used in 2 attempts without being able to achieve entry into the peritoneum. Decision made at that point proceed with straight TVH. Small subumbilical incision was closed with subcuticular 4-0 Monocryl. The legs were extended in the vaginal portion the procedure was started at that point.   Weighted speculum was positioned cervix grasped with tenaculum the cervical vaginal mucosa was incised circumferentially posterior culdotomy performed without difficulty, the bladder was advanced superiorly with sharp and blunt dissection until peritoneum to be identified this was entered sharply and a retractor then used to gently elevate the bladder out of the field. In sequential manner staying close to the uterus the LigaSure device was then used to coagulated and divided the uterosacral ligament, cardinal ligament, uterine vasculature pedicles and upper broad ligament pedicles. The fundus of the uterus is in delivered posteriorly remaining pedicles were clamped divided first free tie followed by suture  ligature of Vicryl. The posterior vaginal cuff was then closed from 3 to 9:00 with a running locked 2-0 Monocryl suture. Prior to closure sponge denies precast reported as correct x2 bilateral tubes and ovaries were inspected and palpated noted to be normal. The vaginal mucosa closed right to left with interrupted 2-0 Monocryl sutures with excellent hemostasis Foley catheter positioned draining clear urine. She did receive Toradol IV at the end of the case went to PACU in good condition.  Dictated with [facility]  Roshan Salamon M. Milana Obey.D.

## 2011-09-16 NOTE — Anesthesia Preprocedure Evaluation (Signed)
Anesthesia Evaluation  Patient identified by MRN, date of birth, ID band Patient awake    Reviewed: Allergy & Precautions, H&P , NPO status , Patient's Chart, lab work & pertinent test results, reviewed documented beta blocker date and time   Airway Mallampati: I TM Distance: >3 FB Neck ROM: full    Dental No notable dental hx. (+) Teeth Intact   Pulmonary neg pulmonary ROS,    Pulmonary exam normal       Cardiovascular hypertension, Pt. on medications and Pt. on home beta blockers     Neuro/Psych PSYCHIATRIC DISORDERS Depression negative neurological ROS     GI/Hepatic negative GI ROS, Neg liver ROS,   Endo/Other  Morbid obesity  Renal/GU negative Renal ROS  negative genitourinary   Musculoskeletal negative musculoskeletal ROS (+)   Abdominal (+) + obese,   Peds negative pediatric ROS (+)  Hematology negative hematology ROS (+)   Anesthesia Other Findings   Reproductive/Obstetrics negative OB ROS                           Anesthesia Physical Anesthesia Plan  ASA: III  Anesthesia Plan: General   Post-op Pain Management:    Induction: Intravenous  Airway Management Planned: Oral ETT  Additional Equipment:   Intra-op Plan:   Post-operative Plan: Extubation in OR  Informed Consent: I have reviewed the patients History and Physical, chart, labs and discussed the procedure including the risks, benefits and alternatives for the proposed anesthesia with the patient or authorized representative who has indicated his/her understanding and acceptance.   Dental Advisory Given  Plan Discussed with: CRNA and Surgeon  Anesthesia Plan Comments:         Anesthesia Quick Evaluation

## 2011-09-16 NOTE — Anesthesia Postprocedure Evaluation (Signed)
  Anesthesia Post-op Note  Patient: Brandi Young  Procedure(s) Performed: Procedure(s) (LRB): HYSTERECTOMY VAGINAL (N/A)  Patient is awake and responsive. Pain and nausea are reasonably well controlled. Vital signs are stable and clinically acceptable. Oxygen saturation is clinically acceptable. There are no apparent anesthetic complications at this time. Patient is ready for discharge.

## 2011-09-16 NOTE — Progress Notes (Signed)
Pt asleep, awakened to assess pain status after medication administration, pt. states pain is an 8/10, snoring at intervals. VSS Respiratory rate is 10 at present, will continue to monitor and manage pain.

## 2011-09-16 NOTE — OR Nursing (Signed)
Procedure attempted LAVH, Vaginal Hysterectomy

## 2011-09-17 ENCOUNTER — Encounter (HOSPITAL_COMMUNITY): Payer: Self-pay | Admitting: Obstetrics and Gynecology

## 2011-09-17 DIAGNOSIS — N814 Uterovaginal prolapse, unspecified: Secondary | ICD-10-CM

## 2011-09-17 LAB — CBC
HCT: 33.7 % — ABNORMAL LOW (ref 36.0–46.0)
Hemoglobin: 11.5 g/dL — ABNORMAL LOW (ref 12.0–15.0)
MCH: 29 pg (ref 26.0–34.0)
MCHC: 34.1 g/dL (ref 30.0–36.0)
MCV: 84.9 fL (ref 78.0–100.0)
Platelets: 277 10*3/uL (ref 150–400)
RBC: 3.97 MIL/uL (ref 3.87–5.11)
RDW: 13.3 % (ref 11.5–15.5)
WBC: 11.5 10*3/uL — ABNORMAL HIGH (ref 4.0–10.5)

## 2011-09-17 MED ORDER — IBUPROFEN 800 MG PO TABS: 800.0000 mg | ORAL_TABLET | Freq: Three times a day (TID) | ORAL | Status: AC | PRN

## 2011-09-17 MED ORDER — OXYCODONE-ACETAMINOPHEN 5-325 MG PO TABS: 1.0000 | ORAL_TABLET | ORAL | Status: AC | PRN

## 2011-09-17 NOTE — Discharge Summary (Signed)
Physician Discharge Summary  Patient ID: SHALANDRIA ELSBERND MRN: [id number] DOB/AGE: 1970-08-10 41 y.o.  Admit date: 09/16/2011 Discharge date: 09/17/2011  Admission Diagnoses:pelvic pain, uterine decensus  Discharge Diagnoses: same Active Problems:  Uterine prolapse   Discharged Condition: good  Hospital Course: attempted LAVH>>>TVH w/o compl, D/C on POD #1 afeb, tol PO abd benign  Consults: None  Significant Diagnostic Studies: labs:  CBC    Component Value Date/Time   WBC 11.5* 09/17/2011 0510   RBC 3.97 09/17/2011 0510   HGB 11.5* 09/17/2011 0510   HCT 33.7* 09/17/2011 0510   PLT 277 09/17/2011 0510   MCV 84.9 09/17/2011 0510   MCH 29.0 09/17/2011 0510   MCHC 34.1 09/17/2011 0510   RDW 13.3 09/17/2011 0510      Treatments: surgery: TVH  Discharge Exam: Blood pressure 129/80, pulse 75, temperature 98.2 F (36.8 C), temperature source Oral, resp. rate 16, height 5' 1.5" (1.562 m), weight 238 lb (107.956 kg), last menstrual period 08/19/2011, SpO2 97.00%. abd + BS, subumb inc C/D @ DC  Disposition: 01-Home or Self Care   Medication List  As of 09/17/2011  8:28 AM   TAKE these medications         amLODipine 2.5 MG tablet   Commonly known as: NORVASC   Take 2.5 mg by mouth daily.      aspirin 81 MG tablet   Take 81 mg by mouth daily.      DULoxetine 30 MG capsule   Commonly known as: CYMBALTA   Take 30 mg by mouth daily.      ibuprofen 800 MG tablet   Commonly known as: ADVIL,MOTRIN   Take 1 tablet (800 mg total) by mouth every 8 (eight) hours as needed (mild pain).      levothyroxine 175 MCG tablet   Commonly known as: SYNTHROID, LEVOTHROID   Take 175 mcg by mouth daily. Pt just started taking this medication in the past 2 weeks. This medication replaces Synthriod .      metFORMIN 500 MG (MOD) 24 hr tablet   Commonly known as: GLUMETZA   Take 1,000 mg by mouth daily with breakfast.      metoprolol succinate 100 MG 24 hr tablet   Commonly known as:  TOPROL-XL   Take 100 mg by mouth daily. Take with or immediately following a meal.      oxyCODONE-acetaminophen 5-325 MG per tablet   Commonly known as: PERCOCET/ROXICET   Take 1-2 tablets by mouth every 4 (four) hours as needed (moderate to severe pain (when tolerating fluids)).           Follow-up Information    Follow up with Meriel Pica, MD. Schedule an appointment as soon as possible for a visit in 2 weeks.   Contact information:   [address] [place] [address] [phone]          Signed: Meriel Pica 09/17/2011, 8:28 AM

## 2011-09-17 NOTE — Progress Notes (Signed)
1 Day Post-Op Procedure(s) (LRB): HYSTERECTOMY VAGINAL (N/A)  Subjective: Patient reports tolerating PO.    Objective: I have reviewed patient's vital signs, intake and output and labs.  abd soft + BS CBC    Component Value Date/Time   WBC 11.5* 09/17/2011 0510   RBC 3.97 09/17/2011 0510   HGB 11.5* 09/17/2011 0510   HCT 33.7* 09/17/2011 0510   PLT 277 09/17/2011 0510   MCV 84.9 09/17/2011 0510   MCH 29.0 09/17/2011 0510   MCHC 34.1 09/17/2011 0510   RDW 13.3 09/17/2011 0510     Assessment: s/p Procedure(s) (LRB): HYSTERECTOMY VAGINAL (N/A): stable  Plan: Discharge home  LOS: 1 day    Juliocesar Blasius M 09/17/2011, 8:25 AM

## 2012-06-26 ENCOUNTER — Emergency Department (HOSPITAL_COMMUNITY)
Admission: EM | Admit: 2012-06-26 | Discharge: 2012-06-26 | Disposition: A | Discharge status: Home / Self Care | Payer: BC Managed Care – PPO | Attending: Emergency Medicine | Admitting: Emergency Medicine

## 2012-06-26 ENCOUNTER — Emergency Department (HOSPITAL_COMMUNITY): Payer: BC Managed Care – PPO

## 2012-06-26 ENCOUNTER — Encounter (HOSPITAL_COMMUNITY): Payer: Self-pay

## 2012-06-26 DIAGNOSIS — F329 Major depressive disorder, single episode, unspecified: Secondary | ICD-10-CM | POA: Insufficient documentation

## 2012-06-26 DIAGNOSIS — I10 Essential (primary) hypertension: Secondary | ICD-10-CM | POA: Insufficient documentation

## 2012-06-26 DIAGNOSIS — K2970 Gastritis, unspecified, without bleeding: Secondary | ICD-10-CM | POA: Insufficient documentation

## 2012-06-26 DIAGNOSIS — R0789 Other chest pain: Secondary | ICD-10-CM | POA: Insufficient documentation

## 2012-06-26 DIAGNOSIS — F3289 Other specified depressive episodes: Secondary | ICD-10-CM | POA: Insufficient documentation

## 2012-06-26 DIAGNOSIS — F411 Generalized anxiety disorder: Secondary | ICD-10-CM | POA: Insufficient documentation

## 2012-06-26 DIAGNOSIS — R0602 Shortness of breath: Secondary | ICD-10-CM | POA: Insufficient documentation

## 2012-06-26 DIAGNOSIS — E039 Hypothyroidism, unspecified: Secondary | ICD-10-CM | POA: Insufficient documentation

## 2012-06-26 DIAGNOSIS — Z7982 Long term (current) use of aspirin: Secondary | ICD-10-CM | POA: Insufficient documentation

## 2012-06-26 DIAGNOSIS — Z79899 Other long term (current) drug therapy: Secondary | ICD-10-CM | POA: Insufficient documentation

## 2012-06-26 DIAGNOSIS — R21 Rash and other nonspecific skin eruption: Secondary | ICD-10-CM | POA: Insufficient documentation

## 2012-06-26 LAB — BASIC METABOLIC PANEL
BUN: 11 mg/dL (ref 6–23)
CO2: 28 mEq/L (ref 19–32)
Calcium: 9.2 mg/dL (ref 8.4–10.5)
Chloride: 101 mEq/L (ref 96–112)
Creatinine, Ser: 0.59 mg/dL (ref 0.50–1.10)
GFR calc Af Amer: >90 mL/min (ref >90)
GFR calc non Af Amer: >90 mL/min (ref >90)
Glucose, Bld: 94 mg/dL (ref 70–99)
Potassium: 4.5 mEq/L (ref 3.5–5.1)
Sodium: 136 mEq/L (ref 135–145)

## 2012-06-26 LAB — CBC
HCT: 38.9 % (ref 36.0–46.0)
Hemoglobin: 13.9 g/dL (ref 12.0–15.0)
MCH: 28.4 pg (ref 26.0–34.0)
MCHC: 35.7 g/dL (ref 30.0–36.0)
MCV: 79.6 fL (ref 78.0–100.0)
Platelets: 229 10*3/uL (ref 150–400)
RBC: 4.89 MIL/uL (ref 3.87–5.11)
RDW: 13.1 % (ref 11.5–15.5)
WBC: 7.1 10*3/uL (ref 4.0–10.5)

## 2012-06-26 LAB — POCT I-STAT TROPONIN I: Troponin i, poc: 0 ng/mL (ref 0.00–0.08)

## 2012-06-26 LAB — PRO B NATRIURETIC PEPTIDE: Pro B Natriuretic peptide (BNP): 54 pg/mL (ref 0–125)

## 2012-06-26 MED ORDER — GI COCKTAIL ~~LOC~~
30.0000 mL | Freq: Once | ORAL | Status: AC
  Administered 2012-06-26: 30 mL via ORAL
  Filled 2012-06-26: qty 30

## 2012-06-26 MED ORDER — LANSOPRAZOLE 30 MG PO CPDR: 30.0000 mg | DELAYED_RELEASE_CAPSULE | Freq: Every day | ORAL | Status: DC

## 2012-06-26 NOTE — ED Provider Notes (Signed)
History     CSN: [id number]  Arrival date & time 06/26/12  [id number]   First MD Initiated Contact with Patient 06/26/12 [phone]      Chief Complaint  Patient presents with  . Chest Pain    (Consider location/radiation/quality/duration/timing/severity/associated sxs/prior treatment) HPI Pt woke with epigastric burning radiating to central chest. States she ate lots of "junk food" over the weekend. Pt states she began researching symptoms on the Internet and became anxious with SOB and central chest pressure. She also developed tingling in her hands. No recent travel, cough, fever chills.  Past Medical History  Diagnosis Date  . Hypertension   . Thyroid disease   . Depression   . Hypothyroidism   . SVD (spontaneous vaginal delivery)     x 1    Past Surgical History  Procedure Laterality Date  . Dilation and curettage of uterus  2011  . Nasal sinus surgery  2003  . Wisdom tooth extraction    . Vaginal hysterectomy  09/16/2011    Procedure: HYSTERECTOMY VAGINAL;  Surgeon: Meriel Pica, MD;  Location: [facility];  Service: Gynecology;  Laterality: N/A;  . Abdominal hysterectomy      No family history on file.  History  Substance Use Topics  . Smoking status: Never Smoker   . Smokeless tobacco: Never Used  . Alcohol Use: No    OB History   Grav Para Term Preterm Abortions TAB SAB Ect Mult Living                  Review of Systems  Constitutional: Negative for fever and chills.  HENT: Negative for neck pain.   Respiratory: Positive for shortness of breath. Negative for cough, chest tightness and wheezing.   Cardiovascular: Positive for chest pain. Negative for palpitations and leg swelling.  Gastrointestinal: Negative for nausea, vomiting and abdominal pain.  Musculoskeletal: Negative for myalgias and back pain.  Skin: Negative for rash and wound.  Neurological: Negative for dizziness, weakness, light-headedness, numbness and headaches.  All other systems reviewed and  are negative.    Allergies  Augmentin  Home Medications   Current Outpatient Rx  Name  Route  Sig  Dispense  Refill  . ALPRAZolam (XANAX) 0.5 MG tablet   Oral   Take 0.5 mg by mouth 3 (three) times daily as needed for sleep or anxiety.         Marland Kitchen amLODipine (NORVASC) 2.5 MG tablet   Oral   Take 2.5 mg by mouth daily.         Marland Kitchen aspirin 81 MG tablet   Oral   Take 81 mg by mouth daily.         . DULoxetine (CYMBALTA) 30 MG capsule   Oral   Take 30 mg by mouth daily.         Marland Kitchen levothyroxine (LEVOXYL) 125 MCG tablet   Oral   Take 125 mcg by mouth daily before breakfast.         . metFORMIN (GLUCOPHAGE) 500 MG tablet   Oral   Take 1,000 mg by mouth daily with breakfast.         . metoprolol succinate (TOPROL-XL) 100 MG 24 hr tablet   Oral   Take 100 mg by mouth daily. Take with or immediately following a meal.         . oxyCODONE-acetaminophen (PERCOCET/ROXICET) 5-325 MG per tablet   Oral   Take 1 tablet by mouth every 4 (four) hours as needed for  pain.         . lansoprazole (PREVACID) 30 MG capsule   Oral   Take 1 capsule (30 mg total) by mouth daily.   30 capsule   0     BP 166/100  Pulse 85  Temp(Src) 98.7 F (37.1 C) (Oral)  Resp 15  SpO2 99%  LMP 07/30/2011  Physical Exam  Nursing note and vitals reviewed. Constitutional: She is oriented to person, place, and time. She appears well-developed and well-nourished. No distress.  HENT:  Head: Normocephalic and atraumatic.  Mouth/Throat: Oropharynx is clear and moist.  Eyes: EOM are normal. Pupils are equal, round, and reactive to light.  Neck: Normal range of motion. Neck supple.  Cardiovascular: Normal rate and regular rhythm.   Pulmonary/Chest: Effort normal and breath sounds normal. No respiratory distress. She has no wheezes. She has no rales. She exhibits tenderness (mild chest wall TTP, no crepitance).  Abdominal: Soft. Bowel sounds are normal. She exhibits no distension and no  mass. There is tenderness (Epigastric TTP. No rebound or guarding). There is no rebound and no guarding.  Musculoskeletal: Normal range of motion. She exhibits no edema and no tenderness.  Neurological: She is alert and oriented to person, place, and time.  5/5 motor in all ext, sensation intact  Skin: Skin is warm and dry. No rash noted. No erythema.  Psychiatric: She has a normal mood and affect. Her behavior is normal.    ED Course  Procedures (including critical care time)  Labs Reviewed  CBC  BASIC METABOLIC PANEL  PRO B NATRIURETIC PEPTIDE   Dg Chest 2 View  06/26/2012  *RADIOLOGY REPORT*  Clinical Data: 42 year old female with chest pain.  CHEST - 2 VIEW  Comparison: None.  Findings: Normal lung volumes.  Mild elevation of the right hemidiaphragm.  Cardiac size and mediastinal contours are within normal limits.  Visualized tracheal air column is within normal limits.  Lung parenchyma within normal limits.  No pneumothorax or pleural effusion.  Occasional degenerative endplate osteophytes in the thoracic spine. No acute osseous abnormality identified.  IMPRESSION: No acute cardiopulmonary abnormality.   Original Report Authenticated By: Erskine Speed, M.D.      1. Gastritis   2. Atypical chest pain       Date: 06/26/2012  Rate: 92  Rhythm: normal sinus rhythm  QRS Axis: normal  Intervals: normal  ST/T Wave abnormalities: nonspecific T wave changes  Conduction Disutrbances:none  Narrative Interpretation:   Old EKG Reviewed: unchanged   MDM   Suspect GERD and anxiety as cause of pt symptoms. Unlikely CAD. Will treat symptomatically, screen and reasess  Pt with improved substernal and epigastric burning. Neg cardiac w/u. Advised to lose weight and f/u with PMD. Pt given gastritis/GERD handout. F/u with Gi as needed.      Loren Racer, MD 06/26/12 [phone]

## 2012-06-26 NOTE — ED Notes (Signed)
Patient transported to X-ray 

## 2012-06-26 NOTE — ED Notes (Signed)
Patient presents with c/o midchest pain that radiates toward the left side. Onset around 5:15 this AM while getting dressed. Also with lower back pain, left arm pain and left neck pain. Reports that fingers are "tingling." Some SOB and headache. No nausea, vomiting, dizziness or abdominal pain.

## 2012-12-06 ENCOUNTER — Ambulatory Visit: Payer: BC Managed Care – PPO | Admitting: Cardiovascular Disease

## 2013-01-09 ENCOUNTER — Ambulatory Visit: Payer: BC Managed Care – PPO | Admitting: Cardiovascular Disease

## 2013-02-26 ENCOUNTER — Encounter: Payer: Self-pay | Admitting: Cardiovascular Disease

## 2013-02-26 ENCOUNTER — Ambulatory Visit (INDEPENDENT_AMBULATORY_CARE_PROVIDER_SITE_OTHER): Payer: BC Managed Care – PPO | Admitting: Cardiovascular Disease

## 2013-02-26 VITALS — BP 140/98 | HR 69 | Ht 61.0 in | Wt 232.3 lb

## 2013-02-26 DIAGNOSIS — E782 Mixed hyperlipidemia: Secondary | ICD-10-CM

## 2013-02-26 DIAGNOSIS — I10 Essential (primary) hypertension: Secondary | ICD-10-CM

## 2013-02-26 DIAGNOSIS — E039 Hypothyroidism, unspecified: Secondary | ICD-10-CM

## 2013-02-26 DIAGNOSIS — E781 Pure hyperglyceridemia: Secondary | ICD-10-CM

## 2013-02-26 DIAGNOSIS — Z79899 Other long term (current) drug therapy: Secondary | ICD-10-CM

## 2013-02-26 DIAGNOSIS — R5383 Other fatigue: Secondary | ICD-10-CM

## 2013-02-26 DIAGNOSIS — R5381 Other malaise: Secondary | ICD-10-CM

## 2013-02-26 DIAGNOSIS — E119 Type 2 diabetes mellitus without complications: Secondary | ICD-10-CM

## 2013-02-26 MED ORDER — HYDROCHLOROTHIAZIDE 12.5 MG PO CAPS: 12.5000 mg | ORAL_CAPSULE | Freq: Every day | ORAL | Status: DC

## 2013-02-26 NOTE — Patient Instructions (Signed)
Start HCTZ 12.5mg  one a day.  Your physician recommends that you return for lab work in: 1-2 weeks at [facility] 1st floor.  8am -12:30pm.  Your physician recommends that you schedule a follow-up appointment in: 3 months

## 2013-02-28 ENCOUNTER — Encounter: Payer: Self-pay | Admitting: Cardiovascular Disease

## 2013-02-28 DIAGNOSIS — E781 Pure hyperglyceridemia: Secondary | ICD-10-CM | POA: Insufficient documentation

## 2013-02-28 DIAGNOSIS — I10 Essential (primary) hypertension: Secondary | ICD-10-CM | POA: Insufficient documentation

## 2013-02-28 DIAGNOSIS — E119 Type 2 diabetes mellitus without complications: Secondary | ICD-10-CM | POA: Insufficient documentation

## 2013-02-28 DIAGNOSIS — E039 Hypothyroidism, unspecified: Secondary | ICD-10-CM | POA: Insufficient documentation

## 2013-02-28 NOTE — Progress Notes (Signed)
Patient ID: Brandi Young, female   DOB: Jul 15, 1970, 42 y.o.   MRN: [id number]     HPI: Brandi Young is a 42 y.o. female who presents for cardiology evaluation. I last saw her approximately 16 months ago.  Brandi Young has a history of metabolic syndrome, hypertension, obesity, as well as hypothyroidism. She also has had significant weight fluctuation with her weight from 248 to 208 between 2011 and 2012 and later regained majority of that weight back.  Over the past year, she has continued to work at [employer]. She does try to walk daily. She again over the last month his try to initiate weight loss. She also history of vitamin D insufficiency.  She states her blood pressure has been fairly well-controlled. She does have hypothyroidism and is now on metformin for diabetes. She denies chest pressure. In 2010 an echo Doppler study showed normal systolic function. She had mild TR appear at that time she was experiencing some chest pain and a nuclear perfusion study was low risk.  Recently she had laboratory and regional which showed a potassium of 4.1. BUN creatinine were normal at 17 and 0.71. Liver function studies were normal. Fasting glucose was 94. Hemoglobin 13.5 hematocrit 30.6. Total cholesterol was 183. However, triglycerides were markedly elevated at 469 with an HDL of 38. Her TSH was 0.102. This laboratory was done and regional by [facility] medical. She was never given any treatment for her markedly elevated triglycerides and was told that this will be rechecked after she made dietary adjustments.  Past Medical History  Diagnosis Date  . Hypertension   . Thyroid disease   . Depression   . Hypothyroidism   . SVD (spontaneous vaginal delivery)     x 1    Past Surgical History  Procedure Laterality Date  . Dilation and curettage of uterus  2011  . Nasal sinus surgery  2003  . Wisdom tooth extraction    . Vaginal hysterectomy  09/16/2011    Procedure: HYSTERECTOMY  VAGINAL;  Surgeon: Meriel Pica, MD;  Location: [facility];  Service: Gynecology;  Laterality: N/A;  . Abdominal hysterectomy      Allergies  Allergen Reactions  . Augmentin [Amoxicillin-Pot Clavulanate] Other (See Comments)    Yeast infection    Current Outpatient Prescriptions  Medication Sig Dispense Refill  . ALPRAZolam (XANAX) 0.5 MG tablet Take 0.5 mg by mouth 3 (three) times daily as needed for sleep or anxiety.      Marland Kitchen amLODipine (NORVASC) 5 MG tablet Take 5 mg by mouth daily.      Marland Kitchen aspirin 81 MG tablet Take 81 mg by mouth daily.      . DULoxetine (CYMBALTA) 30 MG capsule Take 30 mg by mouth daily.      Marland Kitchen esomeprazole (NEXIUM) 20 MG capsule Take 20 mg by mouth daily.      . fluticasone (FLONASE) 50 MCG/ACT nasal spray Place 2 sprays into both nostrils daily.       Marland Kitchen levothyroxine (LEVOXYL) 125 MCG tablet Take 125 mcg by mouth daily before breakfast.      . metFORMIN (GLUCOPHAGE) 500 MG tablet Take 1,000 mg by mouth daily with breakfast.      . metoprolol succinate (TOPROL-XL) 100 MG 24 hr tablet Take 100 mg by mouth daily. Take with or immediately following a meal.      . hydrochlorothiazide (MICROZIDE) 12.5 MG capsule Take 1 capsule (12.5 mg total) by mouth daily.  30 capsule  6  No current facility-administered medications for this visit.    History   Social History  . Marital Status: Married    Spouse Name: N/A    Number of Children: N/A  . Years of Education: N/A   Occupational History  . Not on file.   Social History Main Topics  . Smoking status: Never Smoker   . Smokeless tobacco: Never Used  . Alcohol Use: No  . Drug Use: No  . Sexual Activity: Yes    Birth Control/ Protection: None   Other Topics Concern  . Not on file   Social History Narrative  . No narrative on file   Socially she is married and has one child. There is no history of tobacco use. She does not drink alcohol. She is now trying to resume some walking.  Family History  Problem  Relation Age of Onset  . Heart disease Father     ROS is negative for fevers, chills or night sweats. She denies change in vision or hearing. There are no headaches. At times she is under increased stress. She denies palpitations. Times there is mild shortness of breath. She denies chest pressure. There is no cough or purulent sputum. There is no wheezing. She denies significant recent weight loss. There is no blood in stool or urine. She denies dyspepsia. She denies claudication. There is some mild leg edema intermittently. She denies tremors. She denies neurologic symptoms.  Other comprehensive 12 point system review is negative.  PE BP 140/98  Pulse 69  Ht 5\' 1"  (1.549 m)  Wt 232 lb 4.8 oz (105.371 kg)  BMI 43.92 kg/m2  LMP 07/30/2011  General: Alert, oriented, no distress.  Skin: normal turgor, no rashes HEENT: Normocephalic, atraumatic. Pupils round and reactive; sclera anicteric;no lid lag.  Nose without nasal septal hypertrophy Mouth/Parynx benign; Mallinpatti scale 3 Neck: No JVD, no carotid briuts Lungs: clear to ausculatation and percussion; no wheezing or rales Chest wall: no tenderness to palpitation Heart: RRR, s1 s2 normal over 6 systolic murmur, unchanged Abdomen: Moderately severe central adiposity; soft, nontender; no hepatosplenomehaly, BS+; abdominal aorta nontender and not dilated by palpation. Back: no CVA tenderness Pulses 2+ Extremities: no clubbing cyanosis or edema, Homan's sign negative  Neurologic: grossly nonfocal Psychologic: normal affect and mood.  ECG: Sinus rhythm with LVH by voltage criteria. Nonspecific T changes.  LABS:  BMET    Component Value Date/Time   NA 136 06/26/2012 0739   K 4.5 06/26/2012 0739   CL 101 06/26/2012 0739   CO2 28 06/26/2012 0739   GLUCOSE 94 06/26/2012 0739   BUN 11 06/26/2012 0739   CREATININE 0.59 06/26/2012 0739   CALCIUM 9.2 06/26/2012 0739   GFRNONAA >90 06/26/2012 0739   GFRAA >90 06/26/2012 0739     Hepatic  Function Panel  No results found for this basename: prot, albumin, ast, alt, alkphos, bilitot, bilidir, ibili     CBC    Component Value Date/Time   WBC 7.1 06/26/2012 0739   RBC 4.89 06/26/2012 0739   HGB 13.9 06/26/2012 0739   HCT 38.9 06/26/2012 0739   PLT 229 06/26/2012 0739   MCV 79.6 06/26/2012 0739   MCH 28.4 06/26/2012 0739   MCHC 35.7 06/26/2012 0739   RDW 13.1 06/26/2012 0739     BNP    Component Value Date/Time   PROBNP 54.0 06/26/2012 0740    Lipid Panel  No results found for this basename: chol, trig, hdl, cholhdl, vldl, ldlcalc     RADIOLOGY:  No results found.    ASSESSMENT AND PLAN: I had a long discussion with Brandi Young for approximately 30 minutes. She does have a history of metabolic syndrome and possible early diabetes which now is relatively controlled with low-dose metformin. She does have significant obesity and today he is hypertensive. She has been on amlodipine 5 mg as well as Toprol-XL 100 mg daily. She's not had any palpitations. I am starting her on HCTZ 12.5 mg which should provide benefit both for blood pressure control as well as potential Lotrimin edema. Had a long discussion with her concerning an exercise program as well as increasing abdominal core strength. We also discussed a weight loss program. She has a target weight loss of approximately 80 pounds. I'm concerned about her marked hypertriglyceridemia potential to induce pancreatitis. I have recommended at least fish oil. However, I am rechecking laboratory consisting of an NMR profile, CMET, CBC, TSH level and further adjustments will be made accordingly. I will see her in 3 months for followup neurological evaluation     Lennette Bihari, MD, [facility]  02/28/2013 5:32 PM

## 2013-03-13 ENCOUNTER — Encounter: Payer: Self-pay | Admitting: Cardiovascular Disease

## 2013-03-19 LAB — COMPREHENSIVE METABOLIC PANEL
ALT: 32 U/L (ref 0–35)
AST: 25 U/L (ref 0–37)
Albumin: 4.1 g/dL (ref 3.5–5.2)
Alkaline Phosphatase: 76 U/L (ref 39–117)
BUN: 18 mg/dL (ref 6–23)
CO2: 27 mEq/L (ref 19–32)
Calcium: 9.5 mg/dL (ref 8.4–10.5)
Chloride: 101 mEq/L (ref 96–112)
Creat: 0.66 mg/dL (ref 0.50–1.10)
Glucose, Bld: 84 mg/dL (ref 70–99)
Potassium: 4.2 mEq/L (ref 3.5–5.3)
Sodium: 139 mEq/L (ref 135–145)
Total Bilirubin: 0.5 mg/dL (ref 0.3–1.2)
Total Protein: 7.7 g/dL (ref 6.0–8.3)

## 2013-03-19 LAB — CBC
HCT: 38.4 % (ref 36.0–46.0)
Hemoglobin: 13.6 g/dL (ref 12.0–15.0)
MCH: 28.9 pg (ref 26.0–34.0)
MCHC: 35.4 g/dL (ref 30.0–36.0)
MCV: 81.5 fL (ref 78.0–100.0)
Platelets: 312 10*3/uL (ref 150–400)
RBC: 4.71 MIL/uL (ref 3.87–5.11)
RDW: 15.4 % (ref 11.5–15.5)
WBC: 6 10*3/uL (ref 4.0–10.5)

## 2013-03-19 LAB — TSH: TSH: 0.491 u[IU]/mL (ref 0.350–4.500)

## 2013-03-20 LAB — NMR LIPOPROFILE WITH LIPIDS
Cholesterol, Total: 193 mg/dL (ref <200)
HDL Particle Number: 34.7 umol/L (ref >=30.5)
HDL Size: 8.8 nm — ABNORMAL LOW (ref >=9.2)
HDL-C: 52 mg/dL (ref >=40)
LDL (calc): 114 mg/dL — ABNORMAL HIGH (ref <100)
LDL Particle Number: 785 nmol/L (ref <1000)
LDL Size: 19.7 nm — ABNORMAL LOW (ref >20.5)
LP-IR Score: 62 — ABNORMAL HIGH (ref <=45)
Large HDL-P: 3.6 umol/L — ABNORMAL LOW (ref >=4.8)
Large VLDL-P: 6.3 nmol/L — ABNORMAL HIGH (ref <=2.7)
Small LDL Particle Number: 270 nmol/L (ref <=527)
Triglycerides: 135 mg/dL (ref <150)
VLDL Size: 47.4 nm — ABNORMAL HIGH (ref <=46.6)

## 2013-04-12 ENCOUNTER — Encounter: Payer: Self-pay | Admitting: *Deleted

## 2013-05-18 ENCOUNTER — Other Ambulatory Visit: Payer: Self-pay | Admitting: Cardiovascular Disease

## 2013-05-21 ENCOUNTER — Ambulatory Visit: Payer: BC Managed Care – PPO | Admitting: Cardiovascular Disease

## 2013-05-28 ENCOUNTER — Ambulatory Visit: Payer: BC Managed Care – PPO | Admitting: Cardiovascular Disease

## 2013-08-08 ENCOUNTER — Encounter: Payer: Self-pay | Admitting: *Deleted

## 2013-08-12 ENCOUNTER — Other Ambulatory Visit: Payer: Self-pay | Admitting: Cardiovascular Disease

## 2013-08-13 ENCOUNTER — Ambulatory Visit: Payer: BC Managed Care – PPO | Admitting: Cardiovascular Disease

## 2013-08-13 NOTE — Telephone Encounter (Signed)
Rx was sent to pharmacy electronically. 

## 2013-09-14 ENCOUNTER — Ambulatory Visit
Admission: RE | Admit: 2013-09-14 | Discharge: 2013-09-14 | Disposition: A | Discharge status: Home / Self Care | Payer: BC Managed Care – PPO | Source: Ambulatory Visit | Attending: Allergy | Admitting: Allergy

## 2013-09-14 ENCOUNTER — Other Ambulatory Visit: Payer: Self-pay | Admitting: Allergy

## 2013-09-14 DIAGNOSIS — J329 Chronic sinusitis, unspecified: Secondary | ICD-10-CM

## 2013-10-05 ENCOUNTER — Other Ambulatory Visit: Payer: Self-pay | Admitting: Cardiovascular Disease

## 2013-11-03 ENCOUNTER — Other Ambulatory Visit: Payer: Self-pay | Admitting: Cardiovascular Disease

## 2013-11-06 NOTE — Telephone Encounter (Signed)
Rx refill sent to patient pharmacy   

## 2014-02-02 ENCOUNTER — Other Ambulatory Visit: Payer: Self-pay | Admitting: Cardiovascular Disease

## 2014-02-04 NOTE — Telephone Encounter (Signed)
Rx was sent to pharmacy electronically. 

## 2014-02-17 ENCOUNTER — Other Ambulatory Visit: Payer: Self-pay | Admitting: Cardiovascular Disease

## 2014-02-18 NOTE — Telephone Encounter (Signed)
E-SENT TO PHARMACY  NEEDS AN APPOINTMENT BEFORE NEXT REFILL

## 2014-03-14 ENCOUNTER — Other Ambulatory Visit: Payer: Self-pay | Admitting: Cardiovascular Disease

## 2014-03-14 NOTE — Telephone Encounter (Signed)
Rx(s) sent to pharmacy electronically. Message sent to scheduler to contact patient for appointment.  

## 2014-03-20 ENCOUNTER — Telehealth: Payer: Self-pay | Admitting: Cardiovascular Disease

## 2014-03-20 NOTE — Telephone Encounter (Signed)
Closed encounter °

## 2014-03-27 ENCOUNTER — Telehealth: Payer: Self-pay | Admitting: Cardiovascular Disease

## 2014-03-30 ENCOUNTER — Other Ambulatory Visit: Payer: Self-pay | Admitting: Cardiovascular Disease

## 2014-04-01 ENCOUNTER — Encounter (HOSPITAL_COMMUNITY): Payer: Self-pay | Admitting: *Deleted

## 2014-04-01 ENCOUNTER — Emergency Department (HOSPITAL_COMMUNITY)
Admission: EM | Admit: 2014-04-01 | Discharge: 2014-04-02 | Disposition: A | Discharge status: Home / Self Care | Payer: BC Managed Care – PPO | Attending: Emergency Medicine | Admitting: Emergency Medicine

## 2014-04-01 ENCOUNTER — Telehealth: Payer: Self-pay | Admitting: Cardiovascular Disease

## 2014-04-01 DIAGNOSIS — E039 Hypothyroidism, unspecified: Secondary | ICD-10-CM | POA: Insufficient documentation

## 2014-04-01 DIAGNOSIS — Z7951 Long term (current) use of inhaled steroids: Secondary | ICD-10-CM | POA: Insufficient documentation

## 2014-04-01 DIAGNOSIS — Z79899 Other long term (current) drug therapy: Secondary | ICD-10-CM | POA: Insufficient documentation

## 2014-04-01 DIAGNOSIS — F329 Major depressive disorder, single episode, unspecified: Secondary | ICD-10-CM | POA: Insufficient documentation

## 2014-04-01 DIAGNOSIS — Z7982 Long term (current) use of aspirin: Secondary | ICD-10-CM | POA: Diagnosis not present

## 2014-04-01 DIAGNOSIS — I10 Essential (primary) hypertension: Secondary | ICD-10-CM | POA: Insufficient documentation

## 2014-04-01 DIAGNOSIS — R2243 Localized swelling, mass and lump, lower limb, bilateral: Secondary | ICD-10-CM | POA: Diagnosis present

## 2014-04-01 DIAGNOSIS — R609 Edema, unspecified: Secondary | ICD-10-CM

## 2014-04-01 LAB — COMPREHENSIVE METABOLIC PANEL
ALT: 26 U/L (ref 0–35)
AST: 25 U/L (ref 0–37)
Albumin: 3.7 g/dL (ref 3.5–5.2)
Alkaline Phosphatase: 76 U/L (ref 39–117)
Anion gap: 6 (ref 5–15)
BUN: 16 mg/dL (ref 6–23)
CO2: 28 mmol/L (ref 19–32)
Calcium: 9.3 mg/dL (ref 8.4–10.5)
Chloride: 103 mmol/L (ref 96–112)
Creatinine, Ser: 0.79 mg/dL (ref 0.50–1.10)
GFR calc Af Amer: >90 mL/min (ref >90)
GFR calc non Af Amer: >90 mL/min (ref >90)
Glucose, Bld: 116 mg/dL — ABNORMAL HIGH (ref 70–99)
Potassium: 4.1 mmol/L (ref 3.5–5.1)
Sodium: 137 mmol/L (ref 135–145)
Total Bilirubin: 0.5 mg/dL (ref 0.3–1.2)
Total Protein: 7.7 g/dL (ref 6.0–8.3)

## 2014-04-01 LAB — CBC WITH DIFFERENTIAL/PLATELET
Basophils Absolute: 0.1 10*3/uL (ref 0.0–0.1)
Basophils Relative: 1 % (ref 0–1)
Eosinophils Absolute: 0.7 10*3/uL (ref 0.0–0.7)
Eosinophils Relative: 9 % — ABNORMAL HIGH (ref 0–5)
HCT: 36.8 % (ref 36.0–46.0)
Hemoglobin: 12.7 g/dL (ref 12.0–15.0)
Lymphocytes Relative: 37 % (ref 12–46)
Lymphs Abs: 2.8 10*3/uL (ref 0.7–4.0)
MCH: 28.3 pg (ref 26.0–34.0)
MCHC: 34.5 g/dL (ref 30.0–36.0)
MCV: 82 fL (ref 78.0–100.0)
Monocytes Absolute: 0.7 10*3/uL (ref 0.1–1.0)
Monocytes Relative: 9 % (ref 3–12)
Neutro Abs: 3.4 10*3/uL (ref 1.7–7.7)
Neutrophils Relative %: 44 % (ref 43–77)
Platelets: 285 10*3/uL (ref 150–400)
RBC: 4.49 MIL/uL (ref 3.87–5.11)
RDW: 13.3 % (ref 11.5–15.5)
WBC: 7.5 10*3/uL (ref 4.0–10.5)

## 2014-04-01 LAB — D-DIMER, QUANTITATIVE: D-Dimer, Quant: 0.35 ug/mL-FEU (ref 0.00–0.48)

## 2014-04-01 MED ORDER — METOPROLOL SUCCINATE ER 100 MG PO TB24: 100.0000 mg | ORAL_TABLET | Freq: Every day | ORAL | Status: DC

## 2014-04-01 NOTE — ED Notes (Signed)
Dr. James at the bedside.  

## 2014-04-01 NOTE — Telephone Encounter (Signed)
Rx refill sent to patient pharmacy  Patient has appointment Feb. 2016

## 2014-04-01 NOTE — ED Provider Notes (Signed)
CSN: [id number]     Arrival date & time 04/01/14  2128 History  This chart was scribed for Rolland PorterMark Caylen Yardley, MD by Evon Slackerrance Branch, ED Scribe. This patient was seen in room A04C/A04C and the patient's care was started at 11:17 PM.      Chief Complaint  Patient presents with  . Leg Swelling  . Leg Pain   Patient is a 44 y.o. female presenting with leg pain. The history is provided by the patient. No language interpreter was used.  Leg Pain Associated symptoms: no fatigue and no fever    HPI Comments: Brandi Young is a 44 y.o. female who presents to the Emergency Department complaining of bilateral leg swelling onset today. Pt states that the feels as if the left leg is more swollen than the right. Pt states she thinks that maybe the salt in her diet is causing her to have the leg swelling. Pt states she has been on the hydrochlorothiazide for the past year but has been sporadic over the past month. Pt states she has missed this medication about 3 times over the past 2 weeks. Pt states she has been on metoprolol for the past year as well. Pt states she has intermittent pain in left leg more noticeable in the calf and hip area. Pt states she also feels as if the left leg is heavier than the right.  Pt states she has also felt bloated today. Denies SOB, CP or other related symptoms. Denies family Hx of DVT or PE Pt states she has Hx of miscarriage in 2011.   Past Medical History  Diagnosis Date  . Hypertension   . Thyroid disease   . Depression   . Hypothyroidism   . SVD (spontaneous vaginal delivery)     x 1  . Metabolic syndrome     Probable   Past Surgical History  Procedure Laterality Date  . Dilation and curettage of uterus  2011  . Nasal sinus surgery  2003  . Wisdom tooth extraction    . Vaginal hysterectomy  09/16/2011    Procedure: HYSTERECTOMY VAGINAL;  Surgeon: Meriel Picaichard M Holland, MD;  Location: [facility];  Service: Gynecology;  Laterality: N/A;  . [place] echocardiography  10/11/2008     Normal  . Nm myocar perf wall motion  10/11/2008    perfusion defect c/w breast artifact, though region of ischemia can not be excluded.  LOW RISK SCAN   Family History  Problem Relation Age of Onset  . Heart disease Father   . Hyperlipidemia Mother   . Hypertension Mother   . Diabetes Father   . Heart disease Mother   . Hyperlipidemia Father    History  Substance Use Topics  . Smoking status: Never Smoker   . Smokeless tobacco: Never Used  . Alcohol Use: No   OB History    No data available      Review of Systems  Constitutional: Negative for fever, chills, diaphoresis, appetite change and fatigue.  HENT: Negative for mouth sores, sore throat and trouble swallowing.   Eyes: Negative for visual disturbance.  Respiratory: Negative for cough, chest tightness, shortness of breath and wheezing.   Cardiovascular: Positive for leg swelling. Negative for chest pain.  Gastrointestinal: Negative for nausea, vomiting, abdominal pain, diarrhea and abdominal distention.  Endocrine: Negative for polydipsia, polyphagia and polyuria.  Genitourinary: Negative for dysuria, frequency and hematuria.  Musculoskeletal: Positive for myalgias. Negative for gait problem.  Skin: Negative for color change, pallor and rash.  Neurological: Negative for dizziness, syncope, light-headedness and headaches.  Hematological: Does not bruise/bleed easily.  Psychiatric/Behavioral: Negative for behavioral problems and confusion.     Allergies  Augmentin  Home Medications   Prior to Admission medications   Medication Sig Start Date End Date Taking? Authorizing Provider  ALPRAZolam Prudy Feeler) 0.5 MG tablet Take 0.5 mg by mouth 3 (three) times daily as needed for sleep or anxiety.   Yes Historical Provider, MD  amLODipine (NORVASC) 5 MG tablet Take 1 tablet (5 mg total) by mouth daily. NEED APPOINTMENT 02/18/14  Yes Lennette Bihari, MD  aspirin 81 MG tablet Take 81 mg by mouth daily.   Yes Historical Provider,  MD  esomeprazole (NEXIUM) 20 MG capsule Take 20 mg by mouth daily.   Yes Historical Provider, MD  hydrochlorothiazide (MICROZIDE) 12.5 MG capsule TAKE 1 CAPSULE BY MOUTH DAILY, MAKE APPOINTMENT FOR REFILLS. 04/01/14  Yes Lennette Bihari, MD  levothyroxine (SYNTHROID, LEVOTHROID) 150 MCG tablet Take 150 mcg by mouth daily before breakfast.  03/15/14  Yes Historical Provider, MD  metFORMIN (GLUCOPHAGE) 500 MG tablet Take 1,000 mg by mouth daily with breakfast.   Yes Historical Provider, MD  metoprolol succinate (TOPROL-XL) 100 MG 24 hr tablet Take 1 tablet (100 mg total) by mouth daily. Take with or immediately following a meal. 04/01/14  Yes Lennette Bihari, MD  DULoxetine (CYMBALTA) 30 MG capsule Take 30 mg by mouth daily.    Historical Provider, MD  fluticasone (FLONASE) 50 MCG/ACT nasal spray Place 2 sprays into both nostrils daily.  01/30/13   Historical Provider, MD  levothyroxine (LEVOXYL) 125 MCG tablet Take 125 mcg by mouth daily before breakfast.    Historical Provider, MD   BP 118/84 mmHg  Pulse 70  Temp(Src) 98 F (36.7 C) (Oral)  Resp 16  SpO2 94%  LMP 07/30/2011   Physical Exam  Constitutional: She is oriented to person, place, and time. She appears well-developed and well-nourished. No distress.  HENT:  Head: Normocephalic.  Eyes: Conjunctivae are normal. Pupils are equal, round, and reactive to light. No scleral icterus.  Neck: Normal range of motion. Neck supple. No thyromegaly present.  Cardiovascular: Normal rate and regular rhythm.  Exam reveals no gallop and no friction rub.   No murmur heard. Pulmonary/Chest: Effort normal and breath sounds normal. No respiratory distress. She has no wheezes. She has no rales.  Abdominal: Soft. Bowel sounds are normal. She exhibits no distension. There is no tenderness. There is no rebound.  Musculoskeletal: Normal range of motion. She exhibits edema.  Trace edema in bilateral lower extremities. No asymmetry, no cording, no redness or  tenderness.   Neurological: She is alert and oriented to person, place, and time.  Skin: Skin is warm and dry. No rash noted.  Psychiatric: She has a normal mood and affect. Her behavior is normal.  Nursing note and vitals reviewed.   ED Course  Procedures (including critical care time) DIAGNOSTIC STUDIES: Oxygen Saturation is 100% on RA, normal by my interpretation.    COORDINATION OF CARE: 11:32 PM-Discussed treatment plan with pt at bedside and pt agreed to plan.     Labs Review Labs Reviewed  CBC WITH DIFFERENTIAL/PLATELET - Abnormal; Notable for the following:    Eosinophils Relative 9 (*)    All other components within normal limits  COMPREHENSIVE METABOLIC PANEL - Abnormal; Notable for the following:    Glucose, Bld 116 (*)    All other components within normal limits  D-DIMER, QUANTITATIVE  BRAIN  NATRIURETIC PEPTIDE  HCG, SERUM, QUALITATIVE    Imaging Review Dg Chest 2 View  04/02/2014   CLINICAL DATA:  Acute onset of left leg swelling. Initial encounter.  EXAM: CHEST  2 VIEW  COMPARISON:  Chest radiograph performed 06/26/2012  FINDINGS: The lungs are well-aerated and clear. There is no evidence of focal opacification, pleural effusion or pneumothorax.  The heart is borderline enlarged. No acute osseous abnormalities are seen.  IMPRESSION: No acute cardiopulmonary process seen; borderline cardiomegaly.   Electronically Signed   By: Roanna Raider M.D.   On: 04/02/2014 00:29     EKG Interpretation None      MDM   Final diagnoses:  Edema    Continue HCTZ. PCP f/u.  I personally performed the services described in this documentation, which was scribed in my presence. The recorded information has been reviewed and is accurate.      Rolland Porter, MD 04/02/14 [phone]

## 2014-04-01 NOTE — Telephone Encounter (Signed)
Metoprolol refilled #30 HCTZ refilled earlier on 2/1  Amlodipine refilled #90 on 02/18/14

## 2014-04-01 NOTE — Telephone Encounter (Signed)
Pt need her medicine before her appointment. Please call her Metoprolol,Amlodipine and Hydrochlorothiazide to [phone]

## 2014-04-01 NOTE — ED Notes (Signed)
Pt in c/o bilateral leg swelling that she noticed after work today, states this is not typical for her, the swelling to her right leg has improved since being home but the swelling has remained to her left lower leg, also noticed some pain to her left calf this evening, no redness noted

## 2014-04-02 ENCOUNTER — Emergency Department (HOSPITAL_COMMUNITY): Payer: BC Managed Care – PPO

## 2014-04-02 LAB — BRAIN NATRIURETIC PEPTIDE: B Natriuretic Peptide: 7.8 pg/mL (ref 0.0–100.0)

## 2014-04-02 LAB — HCG, SERUM, QUALITATIVE: Preg, Serum: NEGATIVE

## 2014-04-02 NOTE — ED Notes (Signed)
Phlebotomy is at the bedside 

## 2014-04-02 NOTE — Discharge Instructions (Signed)

## 2014-04-10 NOTE — Telephone Encounter (Signed)
Closed encounter °

## 2014-04-24 ENCOUNTER — Ambulatory Visit: Payer: BC Managed Care – PPO | Admitting: Cardiovascular Disease

## 2014-05-10 ENCOUNTER — Other Ambulatory Visit: Payer: Self-pay | Admitting: Cardiovascular Disease

## 2014-05-10 NOTE — Telephone Encounter (Signed)
Rx has been sent to the pharmacy electronically. ° °

## 2014-06-01 ENCOUNTER — Telehealth: Payer: Self-pay | Admitting: Physician Assistant

## 2014-06-01 ENCOUNTER — Other Ambulatory Visit: Payer: Self-pay | Admitting: Physician Assistant

## 2014-06-01 MED ORDER — METOPROLOL SUCCINATE ER 100 MG PO TB24: 100.0000 mg | ORAL_TABLET | Freq: Every day | ORAL | Status: DC

## 2014-06-01 NOTE — Telephone Encounter (Signed)
Apparently patient lost her metoprolol XL after she went shopping immediately after her visit to pharmacy. I have offered to give her 30 day supply, however she feels her insurance may not cover this so asked me to only send 4 pills of metoprolol XL to her pharmacy. She has a followup with Dr. Tresa EndoKelly on 4/6  Signed, Azalee CourseHao Berit Raczkowski [place] Pager: [phone]

## 2014-06-05 ENCOUNTER — Encounter: Payer: Self-pay | Admitting: Cardiovascular Disease

## 2014-06-05 ENCOUNTER — Ambulatory Visit (INDEPENDENT_AMBULATORY_CARE_PROVIDER_SITE_OTHER): Payer: BC Managed Care – PPO | Admitting: Cardiovascular Disease

## 2014-06-05 VITALS — BP 140/82 | HR 69 | Ht 61.0 in | Wt 245.3 lb

## 2014-06-05 DIAGNOSIS — E039 Hypothyroidism, unspecified: Secondary | ICD-10-CM | POA: Diagnosis not present

## 2014-06-05 DIAGNOSIS — I10 Essential (primary) hypertension: Secondary | ICD-10-CM | POA: Diagnosis not present

## 2014-06-05 DIAGNOSIS — E781 Pure hyperglyceridemia: Secondary | ICD-10-CM

## 2014-06-05 MED ORDER — AMLODIPINE BESYLATE 5 MG PO TABS: 5.0000 mg | ORAL_TABLET | Freq: Every day | ORAL | Status: DC

## 2014-06-05 MED ORDER — HYDROCHLOROTHIAZIDE 12.5 MG PO CAPS: 12.5000 mg | ORAL_CAPSULE | Freq: Every day | ORAL | Status: DC

## 2014-06-05 MED ORDER — METOPROLOL SUCCINATE ER 100 MG PO TB24: 100.0000 mg | ORAL_TABLET | Freq: Every day | ORAL | Status: DC

## 2014-06-05 NOTE — Patient Instructions (Signed)
Your physician wants you to follow-up in: 6 months or sooner if needed with Dr. Kelly. You will receive a reminder letter in the mail two months in advance. If you don't receive a letter, please call our office to schedule the follow-up appointment. 

## 2014-06-07 ENCOUNTER — Encounter: Payer: Self-pay | Admitting: Cardiovascular Disease

## 2014-06-07 NOTE — Progress Notes (Signed)
Patient ID: Brandi Young, female   DOB: 29-Apr-1970, 44 y.o.   MRN: [id number]     HPI: Brandi Young is a 44 y.o. female who presents for a 16 month follow-up cardiology evaluation.   Brandi Young has a history of metabolic syndrome, hypertension, obesity, as well as hypothyroidism. She also has had significant weight fluctuation with her weight from 248 to 208 between 2011 and 2012 and later regained majority of that weight back.  In 2010 an echo Doppler study showed normal systolic function. She had mild TR.  At that time she was experiencing some chest pain and a nuclear perfusion study was low risk.  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 She does try to walk daily. She again over the last month his try to initiate weight loss. She also history of vitamin D insufficiency.  Recently, she admits to significant increased stress.  She had remotely been on Cymbalta but had run out of this medication.  She plans to be evaluated by a psychiatrist in the upcoming month.  She walks occasionally.  She denies any exertional chest pain.  She has been evaluated on 2 instances in the emergency room 1 with somewhat atypical chest pain and the other with leg swelling.  She states in addition to stress at work, oftentimes when she is very anxious.  She eats food for release.  She has an an autistic husband as well as an an autistic son, which also creates some home stress.  I last saw her in December 2014.  She had undergone some blood work 2 months ago which revealed a normal CBC.  A d-dimer was negative.  When she presented with chest pain.  Her fasting glucose was increased at 116.  She's not had lipid studies checked since January 2015 at which time her LDL was 114.  Past Medical History  Diagnosis Date  . Hypertension   . Thyroid disease   . Depression   . Hypothyroidism   . SVD (spontaneous vaginal delivery)     x 1  . Metabolic syndrome     Probable     Past Surgical History  Procedure Laterality Date  . Dilation and curettage of uterus  2011  . Nasal sinus surgery  2003  . Wisdom tooth extraction    . Vaginal hysterectomy  09/16/2011    Procedure: HYSTERECTOMY VAGINAL;  Surgeon: Margarette Asal, MD;  Location: [facility];  Service: Gynecology;  Laterality: N/A;  . US echocardiography  10/11/2008    Normal  . Nm myocar perf wall motion  10/11/2008    perfusion defect c/w breast artifact, though region of ischemia can not be excluded.  LOW RISK SCAN    Allergies  Allergen Reactions  . Augmentin [Amoxicillin-Pot Clavulanate] Other (See Comments)    Yeast infection    Current Outpatient Prescriptions  Medication Sig Dispense Refill  . ALPRAZolam (XANAX) 0.5 MG tablet Take 0.5 mg by mouth 3 (three) times daily as needed for sleep or anxiety.    Marland Kitchen amLODipine (NORVASC) 5 MG tablet Take 1 tablet (5 mg total) by mouth daily. NEED APPOINTMENT 30 tablet 6  . aspirin 81 MG tablet Take 81 mg by mouth daily.    Marland Kitchen esomeprazole (NEXIUM) 20 MG capsule Take 20 mg by mouth daily.    . hydrochlorothiazide (MICROZIDE) 12.5 MG capsule Take 1 capsule (12.5 mg total) by mouth daily. Need appointment before further refills 30 capsule 6  .  levothyroxine (LEVOXYL) 125 MCG tablet Take 125 mcg by mouth daily before breakfast.    . metFORMIN (GLUCOPHAGE) 500 MG tablet Take 1,000 mg by mouth daily with breakfast.    . metoprolol succinate (TOPROL-XL) 100 MG 24 hr tablet Take 1 tablet (100 mg total) by mouth daily. Take with or immediately following a meal. 30 tablet 6   No current facility-administered medications for this visit.    History   Social History  . Marital Status: Married    Spouse Name: N/A  . Number of Children: N/A  . Years of Education: N/A   Occupational History  . Not on file.   Social History Main Topics  . Smoking status: Never Smoker   . Smokeless tobacco: Never Used  . Alcohol Use: No  . Drug Use: No  . Sexual Activity: Yes     Birth Control/ Protection: None   Other Topics Concern  . Not on file   Social History Narrative   Socially she is married and has one child. There is no history of tobacco use. She does not drink alcohol. She is now trying to resume some walking.  Family History  Problem Relation Age of Onset  . Heart disease Father   . Hyperlipidemia Mother   . Hypertension Mother   . Diabetes Father   . Heart disease Mother   . Hyperlipidemia Father     ROS General: Negative; No fevers, chills, or night sweats; no significant benefit with weight loss HEENT: Negative; No changes in vision or hearing, sinus congestion, difficulty swallowing Pulmonary: Negative; No cough, wheezing, shortness of breath, hemoptysis Cardiovascular: Negative; No chest pain, presyncope, syncope, palpitations GI: Negative; No nausea, vomiting, diarrhea, or abdominal pain GU: Negative; No dysuria, hematuria, or difficulty voiding Musculoskeletal: Negative; no myalgias, joint pain, or weakness Hematologic/Oncology: Negative; no easy bruising, bleeding Endocrine: Negative; no heat/cold intolerance; no diabetes Neuro: Negative; no changes in balance, headaches Skin: Negative; No rashes or skin lesions Psychiatric: Positive for anxiety and stress  Sleep: Negative; No snoring, daytime sleepiness, hypersomnolence, bruxism, restless legs, hypnogognic hallucinations, no cataplexy Other comprehensive 14 point system review is negative.   PE BP 140/82 mmHg  Pulse 69  Ht 5' 1"  (1.549 m)  Wt 245 lb 4.8 oz (111.267 kg)  BMI 46.37 kg/m2  LMP 07/30/2011  General: Alert, oriented, no distress.  Skin: normal turgor, no rashes HEENT: Normocephalic, atraumatic. Pupils round and reactive; sclera anicteric;no lid lag.  Nose without nasal septal hypertrophy Mouth/Parynx benign; Mallinpatti scale 3 Neck: Thick neck No JVD, no carotid bruits with normal carotid upstroke Lungs: clear to ausculatation and percussion; no wheezing  or rales Chest wall: no tenderness to palpitation Heart: RRR, s1 s2 normal over 6 systolic murmur, unchanged Abdomen: Moderately severe central adiposity; soft, nontender; no hepatosplenomehaly, BS+; abdominal aorta nontender and not dilated by palpation. Back: no CVA tenderness Pulses 2+ Extremities: no clubbing cyanosis or edema, Homan's sign negative  Neurologic: grossly nonfocal Psychologic: normal affect and mood.  Normal sinus rhythm at 69 bpm.  Borderline LVH by voltage criteria in aVL.  No ST segment changes.  ECG: Sinus rhythm with LVH by voltage criteria. Nonspecific T changes.  LABS:  BMET  BMP Latest Ref Rng 04/01/2014 03/19/2013 06/26/2012  Glucose 70 - 99 mg/dL 116(H) 84 94  BUN 6 - 23 mg/dL 16 18 11   Creatinine 0.50 - 1.10 mg/dL 0.79 0.66 0.59  Sodium 135 - 145 mmol/L 137 139 136  Potassium 3.5 - 5.1 mmol/L 4.1 4.2 4.5  Chloride 96 - 112 mmol/L 103 101 101  CO2 19 - 32 mmol/L 28 27 28   Calcium 8.4 - 10.5 mg/dL 9.3 9.5 9.2     Hepatic Function Panel   Hepatic Function Latest Ref Rng 04/01/2014 03/19/2013  Total Protein 6.0 - 8.3 g/dL 7.7 7.7  Albumin 3.5 - 5.2 g/dL 3.7 4.1  AST 0 - 37 U/L 25 25  ALT 0 - 35 U/L 26 32  Alk Phosphatase 39 - 117 U/L 76 76  Total Bilirubin 0.3 - 1.2 mg/dL 0.5 0.5     CBC  CBC Latest Ref Rng 04/01/2014 03/19/2013 06/26/2012  WBC 4.0 - 10.5 K/uL 7.5 6.0 7.1  Hemoglobin 12.0 - 15.0 g/dL 12.7 13.6 13.9  Hematocrit 36.0 - 46.0 % 36.8 38.4 38.9  Platelets 150 - 400 K/uL 285 312 229   Lab Results  Component Value Date   TSH 0.491 03/19/2013    BNP    Component Value Date/Time   PROBNP 54.0 06/26/2012 0740    Lipid Panel   Lipid Panel     Component Value Date/Time   CHOL 193 03/19/2013 0950   TRIG 135 03/19/2013 0950   HDL 52 03/19/2013 0950   LDLCALC 114* 03/19/2013 0950    RADIOLOGY: No results found.    ASSESSMENT AND PLAN: Brandi Young is now 44 years old and has probable metabolic syndrome contributed by her  morbid obesity.  She has a history of hypertension and her blood pressure today initially was 140/82, but repeat by me was 126/78.  On Toprol-XL 100 mg daily, HCTZ 12.5 mg and amlodipine 5 mg.  She has a history of hypothyroidism and has been on levothyroxine 125 g.  Her diabetes mellitus is being treated with Glucophage 1000 mg in the morning.  She tells me laboratory be drawn at [facility] on 06/26/2014 and only important to follow-up blood work.  She had some atypical chest pain leading to her emergency room evaluation.  Her leg edema has improved with initiation of HCTZ and have suggested that she can take an extra 12.5 mg on an as-needed basis if problems arise.  I again had a long discussion with her concerning weight loss.  She seems to increase food intake during periods of increased stress.  She will be undergoing psychiatric assessment and may be restarted back on anti-anxiolytic medication.  We discussed exercising at least 5 days a week for a minimum of 30 minutes.  I will see her in one year for reevaluation or sooner if problems arise.  Time spent: 25 minutes  Troy Sine, MD, [facility]  06/07/2014 3:00 PM

## 2015-06-12 DIAGNOSIS — R768 Other specified abnormal immunological findings in serum: Secondary | ICD-10-CM | POA: Insufficient documentation

## 2015-06-12 DIAGNOSIS — K219 Gastro-esophageal reflux disease without esophagitis: Secondary | ICD-10-CM | POA: Insufficient documentation

## 2019-05-06 ENCOUNTER — Ambulatory Visit: Payer: BC Managed Care – PPO | Attending: Internal Medicine

## 2019-05-06 DIAGNOSIS — Z23 Encounter for immunization: Secondary | ICD-10-CM | POA: Insufficient documentation

## 2019-05-06 NOTE — Progress Notes (Signed)
   Covid-19 Vaccination Clinic  Name:  Brandi Young    MRN: [id number] DOB: 04-26-1970  05/06/2019  [name] was observed post Covid-19 immunization for 15 minutes without incident. She was provided with Vaccine Information Sheet and instruction to access the V-Safe system.   [name] was instructed to call 911 with any severe reactions post vaccine: Marland Kitchen Difficulty breathing  . Swelling of face and throat  . A fast heartbeat  . A bad rash all over body  . Dizziness and weakness   Immunizations Administered    Name Date Dose VIS Date Route   Pfizer COVID-19 Vaccine 05/06/2019 10:50 AM 0.3 mL 02/09/2019 Intramuscular   Manufacturer: [employer], [employer]   Lot: [id number]   NDC: [address]-3

## 2019-05-27 ENCOUNTER — Ambulatory Visit: Payer: BC Managed Care – PPO | Attending: Internal Medicine

## 2019-05-27 DIAGNOSIS — Z23 Encounter for immunization: Secondary | ICD-10-CM

## 2019-05-27 NOTE — Progress Notes (Signed)
   Covid-19 Vaccination Clinic  Name:  Brandi Young    MRN: [id number] DOB: 19-Apr-1970  05/27/2019  [name] was observed post Covid-19 immunization for 15 minutes without incident. She was provided with Vaccine Information Sheet and instruction to access the V-Safe system.   Ms. Patient was instructed to call 911 with any severe reactions post vaccine: Marland Kitchen Difficulty breathing  . Swelling of face and throat  . A fast heartbeat  . A bad rash all over body  . Dizziness and weakness   Immunizations Administered    Name Date Dose VIS Date Route   Pfizer COVID-19 Vaccine 05/27/2019 10:11 AM 0.3 mL 02/09/2019 Intramuscular   Manufacturer: [employer], [employer]   Lot: [id number]   NDC: [address]-5

## 2019-09-12 ENCOUNTER — Other Ambulatory Visit: Payer: Self-pay

## 2019-09-12 ENCOUNTER — Ambulatory Visit
Admission: EM | Admit: 2019-09-12 | Discharge: 2019-09-12 | Disposition: A | Discharge status: Home / Self Care | Payer: BC Managed Care – PPO | Attending: Emergency Medicine | Admitting: Emergency Medicine

## 2019-09-12 DIAGNOSIS — Z79899 Other long term (current) drug therapy: Secondary | ICD-10-CM | POA: Diagnosis not present

## 2019-09-12 DIAGNOSIS — I10 Essential (primary) hypertension: Secondary | ICD-10-CM | POA: Diagnosis not present

## 2019-09-12 DIAGNOSIS — R197 Diarrhea, unspecified: Secondary | ICD-10-CM | POA: Diagnosis not present

## 2019-09-12 DIAGNOSIS — R1030 Lower abdominal pain, unspecified: Secondary | ICD-10-CM | POA: Diagnosis not present

## 2019-09-12 DIAGNOSIS — Z7984 Long term (current) use of oral hypoglycemic drugs: Secondary | ICD-10-CM | POA: Diagnosis not present

## 2019-09-12 DIAGNOSIS — Z79890 Hormone replacement therapy (postmenopausal): Secondary | ICD-10-CM | POA: Insufficient documentation

## 2019-09-12 DIAGNOSIS — Z7982 Long term (current) use of aspirin: Secondary | ICD-10-CM | POA: Insufficient documentation

## 2019-09-12 DIAGNOSIS — E039 Hypothyroidism, unspecified: Secondary | ICD-10-CM | POA: Diagnosis not present

## 2019-09-12 MED ORDER — DICYCLOMINE HCL 20 MG PO TABS: 20.0000 mg | ORAL_TABLET | Freq: Two times a day (BID) | ORAL | 0 refills | Status: DC

## 2019-09-12 NOTE — ED Triage Notes (Signed)
Pt presents with complaints of diarrhea and generalized abdominal cramping that started on Saturday. Pt developed a headache over the last couple days. Pt reports a queasy feeling sometimes when she is using the bathroom.

## 2019-09-12 NOTE — Discharge Instructions (Addendum)
Stool studies ordered Please follow up with our office with stool sample In the meantime stick to a bland/ high fiber diet.  Stay away from greasy, fried, or fatty foods as this may make your symptoms worse You may try incorporating OTC miralax.  This may bulk up your stools Bentyl prescribed for abdominal cramping.  Use as directed Anticipate follow up with PCP if symptoms persists If you experience new or worsening symptoms return or go to ER such as fever, chills, nausea, vomiting, fatigue, lightheadedness, dizziness, profuse watery diarrhea, bloody or dark tarry stools, constipation, urinary symptoms, worsening abdominal discomfort, symptoms that do not improve with medications, inability to keep fluids down, etc..Marland Kitchen

## 2019-09-12 NOTE — ED Provider Notes (Signed)
[facility]   [id number] 09/12/19 Arrival Time: 1049  CC: Diarrhea  SUBJECTIVE:  SUZZANNE BRUNKHORST is a 49 y.o. female who presents with complaint of watery/ formed diarrhea between 5-10 episodes daily for the past 4 days.  Symptoms began after eating "junk" food she typically does not eat for husband's b-day over the weekend.  However, has switched back to a bland/ normal diet for her and is still having symptoms.  Denies close contacts with similar symptoms, recent travel, recent antibiotic use, or changes in medications.  Reports associated lower abdominal cramping.  Has tried OTC medications without relief.  Denies alleviating or aggravating factors.  Denies certain food triggers.  Reports similar symptoms in the past, however, has never had diarrhea persists for so long.  Last BM this am with diarrhea.  Reports increase in appetite and nausea.    Denies fever, chills, vomiting, chest pain, SOB, diarrhea, constipation, hematochezia, melena, dysuria, difficulty urinating, increased frequency or urgency, flank pain, loss of bowel or bladder function, vaginal discharge, vaginal odor, vaginal bleeding, dyspareunia, pelvic pain.     Patient's last menstrual period was 08/19/2011.  ROS: As per HPI.  All other pertinent ROS negative.     Past Medical History:  Diagnosis Date  . Depression   . Hypertension   . Hypothyroidism   . Metabolic syndrome    Probable  . SVD (spontaneous vaginal delivery)    x 1  . Thyroid disease    Past Surgical History:  Procedure Laterality Date  . DILATION AND CURETTAGE OF UTERUS  2011  . NASAL SINUS SURGERY  2003  . NM MYOCAR PERF WALL MOTION  10/11/2008   perfusion defect c/w breast artifact, though region of ischemia can not be excluded.  LOW RISK SCAN  . US ECHOCARDIOGRAPHY  10/11/2008   Normal  . VAGINAL HYSTERECTOMY  09/16/2011   Procedure: HYSTERECTOMY VAGINAL;  Surgeon: Meriel Pica, MD;  Location: [facility];  Service: Gynecology;   Laterality: N/A;  . WISDOM TOOTH EXTRACTION     No Known Allergies No current facility-administered medications on file prior to encounter.   Current Outpatient Medications on File Prior to Encounter  Medication Sig Dispense Refill  . ALPRAZolam (XANAX) 0.5 MG tablet Take 0.5 mg by mouth 3 (three) times daily as needed for sleep or anxiety.    Marland Kitchen aspirin 81 MG tablet Take 81 mg by mouth daily.    Marland Kitchen buPROPion (WELLBUTRIN) 100 MG tablet Take 100 mg by mouth 2 (two) times daily.    Marland Kitchen esomeprazole (NEXIUM) 20 MG capsule Take 20 mg by mouth daily.    Marland Kitchen FLUoxetine (PROZAC) 20 MG tablet Take 20 mg by mouth daily.    Marland Kitchen levothyroxine (LEVOXYL) 125 MCG tablet Take 125 mcg by mouth daily before breakfast.    . Liraglutide -Weight Management (SAXENDA [place]) Inject into the skin.    . metFORMIN (GLUCOPHAGE) 500 MG tablet Take 1,000 mg by mouth daily with breakfast.    . TRAZODONE HCL PO Take by mouth.    . [DISCONTINUED] metoprolol succinate (TOPROL-XL) 100 MG 24 hr tablet Take 1 tablet (100 mg total) by mouth daily. Take with or immediately following a meal. 30 tablet 6  . [DISCONTINUED] hydrochlorothiazide (MICROZIDE) 12.5 MG capsule Take 1 capsule (12.5 mg total) by mouth daily. Need appointment before further refills 30 capsule 6   Social History   Socioeconomic History  . Marital status: Married    Spouse name: Not on file  . Number of  children: Not on file  . Years of education: Not on file  . Highest education level: Not on file  Occupational History  . Not on file  Tobacco Use  . Smoking status: Never Smoker  . Smokeless tobacco: Never Used  Substance and Sexual Activity  . Alcohol use: No  . Drug use: No  . Sexual activity: Yes    Birth control/protection: None  Other Topics Concern  . Not on file  Social History Narrative  . Not on file   Social Determinants of Health   Financial Resource Strain:   . Difficulty of Paying Living Expenses:   Food Insecurity:   . Worried About  Programme researcher, broadcasting/film/video in the Last Year:   . Barista in the Last Year:   Transportation Needs:   . Freight forwarder (Medical):   Marland Kitchen Lack of Transportation (Non-Medical):   Physical Activity:   . Days of Exercise per Week:   . Minutes of Exercise per Session:   Stress:   . Feeling of Stress :   Social Connections:   . Frequency of Communication with Friends and Family:   . Frequency of Social Gatherings with Friends and Family:   . Attends Religious Services:   . Active Member of Clubs or Organizations:   . Attends Banker Meetings:   Marland Kitchen Marital Status:   Intimate Partner Violence:   . Fear of Current or Ex-Partner:   . Emotionally Abused:   Marland Kitchen Physically Abused:   . Sexually Abused:    Family History  Problem Relation Age of Onset  . Hyperlipidemia Mother   . Hypertension Mother   . Heart disease Mother   . Heart disease Father   . Diabetes Father   . Hyperlipidemia Father      OBJECTIVE:  Vitals:   09/12/19 1125  BP: 110/71  Pulse: 88  Resp: 18  Temp: 98.9 F (37.2 C)  SpO2: 95%    General appearance: Alert; NAD HEENT: NCAT.  Oropharynx clear.  Lungs: clear to auscultation bilaterally without adventitious breath sounds Heart: regular rate and rhythm.   Abdomen: soft, non-distended; normal active bowel sounds; mild diffuse lower abdominal tenderness; nontender at McBurney's point; negative Murphy's sign; no guarding Extremities: no edema; symmetrical with no gross deformities Skin: warm and dry Neurologic: normal gait Psychological: alert and cooperative; normal mood and affect  ASSESSMENT & PLAN:  1. Diarrhea, unspecified type     Meds ordered this encounter  Medications  . dicyclomine (BENTYL) 20 MG tablet    Sig: Take 1 tablet (20 mg total) by mouth 2 (two) times daily.    Dispense:  20 tablet    Refill:  0    Order Specific Question:   Supervising Provider    Answer:   Eustace Moore [[id number]]    Stool studies  ordered Please follow up with our office with stool sample In the meantime stick to a bland/ high fiber diet.  Stay away from greasy, fried, or fatty foods as this may make your symptoms worse You may try incorporating OTC miralax.  This may bulk up your stools Bentyl prescribed for abdominal cramping.  Use as directed Anticipate follow up with PCP if symptoms persists If you experience new or worsening symptoms return or go to ER such as fever, chills, nausea, vomiting, fatigue, lightheadedness, dizziness, profuse watery diarrhea, bloody or dark tarry stools, constipation, urinary symptoms, worsening abdominal discomfort, symptoms that do not improve with medications, inability to  keep fluids down, etc...    Reviewed expectations re: course of current medical issues. Questions answered. Outlined signs and symptoms indicating need for more acute intervention. Patient verbalized understanding. After Visit Summary given.   Rennis Harding, PA-C 09/12/19 1210

## 2019-09-14 LAB — GASTROINTESTINAL PANEL BY PCR, STOOL (REPLACES STOOL CULTURE)

## 2020-03-17 ENCOUNTER — Ambulatory Visit: Payer: BC Managed Care – PPO | Admitting: Physical Therapy

## 2020-03-26 ENCOUNTER — Ambulatory Visit: Payer: BC Managed Care – PPO

## 2020-03-26 ENCOUNTER — Ambulatory Visit: Payer: BC Managed Care – PPO | Attending: Neurology

## 2020-03-26 ENCOUNTER — Other Ambulatory Visit: Payer: Self-pay

## 2020-03-26 DIAGNOSIS — M5441 Lumbago with sciatica, right side: Secondary | ICD-10-CM | POA: Diagnosis not present

## 2020-03-26 DIAGNOSIS — G8929 Other chronic pain: Secondary | ICD-10-CM | POA: Insufficient documentation

## 2020-03-26 DIAGNOSIS — M62830 Muscle spasm of back: Secondary | ICD-10-CM | POA: Diagnosis present

## 2020-03-27 NOTE — Therapy (Addendum)
[facility] [address] [place], [place], [address] Phone: [phone]   Fax:  [phone]  Physical Therapy Evaluation/Discharge  Patient Details  Name: Brandi Young MRN: [id number] Date of Birth: 02-01-1971 Referring Provider (PT): Curt Bears, MD   Encounter Date: 03/26/2020   PT End of Session - 03/27/20 1759    Visit Number 1    Number of Visits 7    Date for PT Re-Evaluation 05/16/20    Authorization Type [employer] PPO    PT Start Time 1619    PT Stop Time 1711    PT Time Calculation (min) 52 min    Activity Tolerance Patient tolerated treatment well    Behavior During Therapy [facility] for tasks assessed/performed           Past Medical History:  Diagnosis Date  . Depression   . Hypertension   . Hypothyroidism   . Metabolic syndrome    Probable  . SVD (spontaneous vaginal delivery)    x 1  . Thyroid disease     Past Surgical History:  Procedure Laterality Date  . DILATION AND CURETTAGE OF UTERUS  2011  . NASAL SINUS SURGERY  2003  . NM MYOCAR PERF WALL MOTION  10/11/2008   perfusion defect c/w breast artifact, though region of ischemia can not be excluded.  LOW RISK SCAN  . US ECHOCARDIOGRAPHY  10/11/2008   Normal  . VAGINAL HYSTERECTOMY  09/16/2011   Procedure: HYSTERECTOMY VAGINAL;  Surgeon: Meriel Pica, MD;  Location: [facility];  Service: Gynecology;  Laterality: N/A;  . WISDOM TOOTH EXTRACTION      There were no vitals filed for this visit.    Subjective Assessment - 03/26/20 1631    Subjective Pt reports low back pain for a couple years, but it has become debilitating. It can be a sharp pain intermittently with walking and standing. Wrose initially in the AM and gets wrose during the day. Sometimes difficult to come to standing after sitting. Lying supine propped in bed reading most consistently increases her pain.    Limitations Walking;Standing    Patient Stated Goals To have less back  pain    Currently in Pain? Yes    Pain Score 7     Pain Location Back    Pain Orientation Right;Posterior    Pain Type Chronic pain    Pain Radiating Towards R gluteal and posterior thigh area    Pain Onset More than a month ago   wrose past 2 months, but and issue for 2 years   Pain Frequency Intermittent    Aggravating Factors  Reading in bed    Pain Relieving Factors Nothing consistently    Effect of Pain on Daily Activities Increasing impact              [facility] PT Assessment - 03/27/20 0001      Assessment   Medical Diagnosis Low back pain, unspecified    Referring Provider (PT) Curt Bears, MD    Onset Date/Surgical Date --   For years, but wroseing over the past several months   Hand Dominance Right    Prior Therapy No      Precautions   Precautions None      Restrictions   Weight Bearing Restrictions No      Balance Screen   Has the patient fallen in the past 6 months No      Home Environment   Living Environment Private residence  Living Arrangements Spouse/significant other;Children    Type of Home House    Home Access Stairs to enter    Entrance Stairs-Number of Steps 3    Entrance Stairs-Rails Right    Home Layout One level      Prior Function   Level of Independence Independent    Vocation Full time employment    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      Cognition   Overall Cognitive Status Within Functional Limits for tasks assessed      Sensation   Light Touch Appears Intact      Posture/Postural Control   Posture/Postural Control Postural limitations    Postural Limitations Increased lumbar lordosis      Deep Tendon Reflexes   DTR Assessment Site Patella;Achilles    Patella DTR 2+    Achilles DTR 2+      ROM / Strength   AROM / PROM / Strength Strength;AROM      AROM   AROM Assessment Site Lumbar    Lumbar Flexion WNLs provoked pain    Lumbar Extension WNLs provoked pain, pinch    Lumbar - Right Side Bend WNLs, provoked  pain, pinch    Lumbar - Left Side Bend WNLs    Lumbar - Right Rotation WNLs, min provoked pain    Lumbar - Left Rotation WNLs, min provoked pain      Strength   Overall Strength Comments LE myotomal screen neg      Palpation   Palpation comment TTP R paraspinals with increase muscle tightness      Special Tests    Special Tests Lumbar    Lumbar Tests Slump Test;Straight Leg Raise      Slump test   Findings Negative      Straight Leg Raise   Findings Positive    Side  Right      Transfers   Transfers Sit to Stand;Stand to Sit    Sit to Stand 7: Independent      Ambulation/Gait   Ambulation/Gait Yes    Ambulation/Gait Assistance 7: Independent    Gait Pattern Within Functional Limits;Step-through pattern                      Objective measurements completed on examination: See above findings.               PT Education - 03/27/20 1657    Education Details Eval findings, POC, HEP and centralization vs peripheralization re: R posterior LE pain.    Person(s) Educated Patient    Methods Explanation;Demonstration;Tactile cues;Verbal cues;Handout    Comprehension Verbalized understanding;Returned demonstration;Verbal cues required;Tactile cues required;Need further instruction            PT Short Term Goals - 03/27/20 1846      PT SHORT TERM GOAL #1   Title Pt will be Ind in an initial HEP    Baseline Started on eval    Status New    Target Date 04/17/20      PT SHORT TERM GOAL #2   Title Pt will voice understanding of measures to assist in pin reduction and the centralization of her R LE pain.             PT Long Term Goals - 03/27/20 1852      PT LONG TERM GOAL #1   Title Pt will be Ind in a final HEP to maintain or progress achieved level of function    Status New  Target Date 05/16/20      PT LONG TERM GOAL #2   Title Pt will report centralization of R LE pain and a reduction of her low back pain to 0-3/10 with decreased  frequency during her daily activities.    Baseline 0-10    Status New    Target Date 05/16/20                  Plan - 03/27/20 1809    Clinical Impression Statement Pt presents with a chronic hx of low back back which has wrosened over the past few months with R LE pain at times extending to the R posterior knee. Lumbar directional movement preference assessement was completed. With prone lying, prone on a pillow f/b prone presses in tolerated range, pt's R LE pain moved from her R thigh into her R gluteal area and her low back pain decreased to 5/10. Pt was provided a HEP to progress from prone lying to press ups as completed in today's session and also in standing back extension periodically during the day at work. Pt was also instructed in proper sitting, avoiding sitting in a sloch position. Pt will benefit from PT 1w6 for continued directional movement peference assessment, Ed for proper posture and body mechanics, strengthening, and the use ofmodalities and manual techniques to reduce pain and optimize functional abilities.    Personal Factors and Comorbidities Time since onset of injury/illness/exacerbation;Comorbidity 1    Comorbidities obesity    Examination-Activity Limitations Sit;Sleep;Bend    Stability/Clinical Decision Making Stable/Uncomplicated    Clinical Decision Making Low    Rehab Potential Good    PT Frequency 1x / week    PT Duration 6 weeks    PT Treatment/Interventions ADLs/Self Care Home Management;Electrical Stimulation;Iontophoresis 4mg /ml Dexamethasone;Moist Heat;Traction;Ultrasound;Therapeutic exercise;Therapeutic activities;Patient/family education;Manual techniques;Dry needling;Taping;Spinal Manipulations    PT Next Visit Plan Assess response to HEP, initiate lumbopelvic strengthening, use modalities, use manual techniques as indicated, complete FOTO    PT Home Exercise Plan    Consulted and Agree with Plan of Care Patient           Patient  will benefit from skilled therapeutic intervention in order to improve the following deficits and impairments:  Improper body mechanics,Pain,Decreased activity tolerance,Obesity  Visit Diagnosis: Chronic right-sided low back pain with right-sided sciatica  Muscle spasm of back     Problem List Patient Active Problem List   Diagnosis Date Noted  . Diabetes (HCC) 02/28/2013  . Essential hypertension 02/28/2013  . Hypertriglyceridemia 02/28/2013  . Morbid obesity (HCC) 02/28/2013  . Hypothyroidism 02/28/2013  . Uterine prolapse 09/17/2011    09/19/2011 MS, PT 03/27/20 7:04 PM  [facility] [address] [place], [place], [place] Phone: [phone]   Fax:  [phone]  Name: Brandi Young MRN: [id number] Dace Date of Birth: 1970-11-19   Pt has not returned to PT following the initial eval.  10/30/1970 MS, PT 05/27/20 9:57 AM

## 2021-07-02 DIAGNOSIS — H9313 Tinnitus, bilateral: Secondary | ICD-10-CM | POA: Insufficient documentation

## 2021-07-02 DIAGNOSIS — H9072 Mixed conductive and sensorineural hearing loss, unilateral, left ear, with unrestricted hearing on the contralateral side: Secondary | ICD-10-CM | POA: Insufficient documentation

## 2021-10-02 DIAGNOSIS — G4730 Sleep apnea, unspecified: Secondary | ICD-10-CM | POA: Insufficient documentation

## 2022-01-06 DIAGNOSIS — K760 Fatty (change of) liver, not elsewhere classified: Secondary | ICD-10-CM | POA: Insufficient documentation

## 2022-04-20 DIAGNOSIS — K5790 Diverticulosis of intestine, part unspecified, without perforation or abscess without bleeding: Secondary | ICD-10-CM | POA: Insufficient documentation

## 2022-04-20 DIAGNOSIS — K649 Unspecified hemorrhoids: Secondary | ICD-10-CM | POA: Insufficient documentation

## 2022-04-20 DIAGNOSIS — K2950 Unspecified chronic gastritis without bleeding: Secondary | ICD-10-CM | POA: Insufficient documentation

## 2022-11-29 ENCOUNTER — Other Ambulatory Visit (HOSPITAL_COMMUNITY): Payer: Self-pay | Admitting: Family Medicine

## 2022-11-29 DIAGNOSIS — N63 Unspecified lump in unspecified breast: Secondary | ICD-10-CM

## 2023-01-03 NOTE — Progress Notes (Unsigned)
Name: Brandi Young DOB: 03-24-1970 MRN: [id number]  History of Present Illness: Brandi Young is a 52 y.o. female who presents today as a new patient at [facility]. All available relevant medical records have been reviewed. She is a Runner, broadcasting/film/video.  Today: She reports chief complaint of OAB symptoms including bothersome urinary frequency, nocturia, urgency, and urge incontinence. Voiding 8-10x/day and 1-2x/night on average. Also reports stress urinary incontinence. Leakage is urge predominant and she has dealt with urinary incontinence for several years. Leaking small volume of urine several times per day typically; has been able to get by without pads or diapers so far but has had to change her clothes previously due to urine leakage. She reports mild caffeine intake (1-2 caffeinated beverages per day on average). She denies prior attempted treatment for these symptoms.  Also reports urinary hesitancy, straining to void, and weak / intermittent stream. She denies dysuria, gross hematuria, or sensations of incomplete emptying. Denies history of recent or recurrent UTIs.   She denies fluid intake within 3 hours prior to bedtime. She denies fluid intake during the night.  She denies caffeine intake within 8 hours prior to bedtime.   Fall Screening: Do you usually have a device to assist in your mobility? No   Medications: Current Outpatient Medications  Medication Sig Dispense Refill   losartan-hydrochlorothiazide (HYZAAR) 100-25 MG tablet Take 1 tablet by mouth daily.     ALPRAZolam (XANAX) 0.5 MG tablet Take 0.5 mg by mouth 3 (three) times daily as needed for sleep or anxiety.     amLODipine (NORVASC) 10 MG tablet Take 10 mg by mouth daily.     aspirin 81 MG tablet Take 81 mg by mouth daily.     buPROPion (WELLBUTRIN) 100 MG tablet Take 100 mg by mouth 2 (two) times daily.     dicyclomine (BENTYL) 20 MG tablet Take 1 tablet (20 mg total) by mouth 2 (two) times daily. 20  tablet 0   esomeprazole (NEXIUM) 20 MG capsule Take 20 mg by mouth daily.     FLUoxetine (PROZAC) 20 MG tablet Take 20 mg by mouth daily.     levothyroxine (LEVOXYL) 125 MCG tablet Take 125 mcg by mouth once a week.     levothyroxine (SYNTHROID) 150 MCG tablet 150 mcg daily. Mon-Sat     Liraglutide -Weight Management (SAXENDA [place]) Inject into the skin.     metFORMIN (GLUCOPHAGE) 500 MG tablet Take 1,000 mg by mouth daily with breakfast.     traZODone (DESYREL) 50 MG tablet Take 150 mg by mouth.     TRINTELLIX 20 MG TABS tablet Take 20 mg by mouth daily.     No current facility-administered medications for this visit.    Allergies: No Known Allergies  Past Medical History:  Diagnosis Date   Anxiety    Depression    Hypertension    Hypothyroidism    Metabolic syndrome    Probable   SVD (spontaneous vaginal delivery)    x 1   Thyroid disease    Past Surgical History:  Procedure Laterality Date   DILATION AND CURETTAGE OF UTERUS  2011   NASAL SINUS SURGERY  2003   NM MYOCAR PERF WALL MOTION  10/11/2008   perfusion defect c/w breast artifact, though region of ischemia can not be excluded.  LOW RISK SCAN   US ECHOCARDIOGRAPHY  10/11/2008   Normal   VAGINAL HYSTERECTOMY  09/16/2011   Procedure: HYSTERECTOMY VAGINAL;  Surgeon: Meriel Pica, MD;  Location: [facility];  Service: Gynecology;  Laterality: N/A;   WISDOM TOOTH EXTRACTION     Family History  Problem Relation Age of Onset   Hyperlipidemia Mother    Hypertension Mother    Heart disease Mother    Heart disease Father    Diabetes Father    Hyperlipidemia Father    Social History   Socioeconomic History   Marital status: Married    Spouse name: Not on file   Number of children: Not on file   Years of education: Not on file   Highest education level: Not on file  Occupational History   Not on file  Tobacco Use   Smoking status: Never   Smokeless tobacco: Never  Substance and Sexual Activity   Alcohol use: No    Drug use: No   Sexual activity: Yes    Birth control/protection: None  Other Topics Concern   Not on file  Social History Narrative   Not on file   Social Determinants of Health   Financial Resource Strain: Not on file  Food Insecurity: Not on file  Transportation Needs: Not on file  Physical Activity: Not on file  Stress: Not on file  Social Connections: Not on file  Intimate Partner Violence: Not on file    SUBJECTIVE  Review of Systems Constitutional: Patient denies any unintentional weight loss or change in strength lntegumentary: Patient denies any rashes or pruritus Cardiovascular: Patient denies chest pain or syncope Respiratory: Patient denies shortness of breath Gastrointestinal: Patient denies nausea, vomiting, or diarrhea. Reports intermittent constipation. Musculoskeletal: Patient denies muscle cramps or weakness Neurologic: Patient denies convulsions or seizures Allergic/Immunologic: Patient denies recent allergic reaction(s) Hematologic/Lymphatic: Patient denies bleeding tendencies Endocrine: Patient denies heat/cold intolerance  GU: As per HPI.  OBJECTIVE Vitals:   01/04/23 0903  BP: 122/77  Pulse: 81  Temp: 98.3 F (36.8 C)   There is no height or weight on file to calculate BMI.  Physical Examination Constitutional: No obvious distress; patient is non-toxic appearing  Cardiovascular: No visible lower extremity edema.  Respiratory: The patient does not have audible wheezing/stridor; respirations do not appear labored  Gastrointestinal: Abdomen non-distended Musculoskeletal: Normal ROM of UEs  Skin: No obvious rashes/open sores  Neurologic: CN 2-12 grossly intact Psychiatric: Answered questions appropriately with normal affect  Hematologic/Lymphatic/Immunologic: No obvious bruises or sites of spontaneous bleeding  UA: no evidence of UTI or microscopic hematuria PVR: 0 ml  ASSESSMENT OAB (overactive bladder) - Plan: Urinalysis, Routine w  reflex microscopic, BLADDER SCAN AMB NON-IMAGING  Urge incontinence  Nocturia  1. OAB with urinary frequency, nocturia, urgency, and urge incontinence. We discussed the symptoms of overactive bladder (OAB), which include urinary urgency, frequency, nocturia, with or without urge incontinence.   While we may not know the exact etiology of OAB, several risk factors can be identified.  - We discussed this patient's neurogenic risk factors for OAB-type symptoms including T2DM.  - Likely exacerbated by caffeine intake.  We discussed the following management options in detail including potential benefits, risks, and side effects: Behavioral therapy: Modify fluid intake Decreasing bladder irritants (such as caffeine, acidic foods, spicy foods, alcohol) Urge suppression strategies Bladder retraining / timed voiding Double voiding Medication(s): - For anticholinergic medications, we discussed the potential side effects of anticholinergics including dry eyes, dry mouth, constipation, cognitive impairment and urinary retention.  - For beta-3 agonist medication, we discussed the risk for urinary retention and the potential side effect of elevated blood pressure specific to Myrbetriq (which is  more likely to occur in individuals with uncontrolled hypertension).   She decided to work on behavioral modifications including minimizing caffeine intake, working on timed voiding, and weight loss.  2. Stress urinary incontinence.  The etiology of this condition was explained in detail to include pelvic floor muscle relaxation and detachment of the urethra away from its connection to the pubic bone. Her risk profile was reviewed, including childbirth.  The management options were reviewed to include: No intervention, observation. Non-surgical options: Pelvic floor muscle rehabilitation Suppression strategies Weight loss Incontinence pessary Surgical consultation   She elected to proceed with weight  loss for stress urinary incontinence.  Will plan for follow up in 6 months or sooner if needed. Pt verbalized understanding and agreement. All questions were answered.  PLAN Advised the following: 1. Minimize caffeine intake. 2. Work on timed voiding. 3. Double/ triple voiding. 4. Weight loss. 5. Return in about 6 months (around 07/04/2023) for UA, PVR, & f/u with Evette Georges NP.  Orders Placed This Encounter  Procedures   Urinalysis, Routine w reflex microscopic   BLADDER SCAN AMB NON-IMAGING    It has been explained that the patient is to follow regularly with their PCP in addition to all other providers involved in their care and to follow instructions provided by these respective offices. Patient advised to contact urology clinic if any urologic-pertaining questions, concerns, new symptoms or problems arise in the interim period.  Patient Instructions       Overactive bladder (OAB) overview for patients:  Symptoms may include: urinary urgency ("gotta go" feeling) urinary frequency (voiding >8 times per day) night time urination (nocturia) urge incontinence of urine (UUI)  While we do not know the exact etiology of OAB, several treatment options exist including:  Behavioral therapy: Reducing fluid intake Decreasing bladder stimulants (such as caffeine) and irritants (such as acidic food, spicy foods, alcohol) Urge suppression strategies Bladder retraining via timed voiding  Pelvic floor physical therapy  Medication(s) - can use one or both of the drug classes below. Anticholinergic / antimuscarinic medications:  Mechanism of action: Activate M3 receptors to reduce detrusor stimulation and increase bladder capacity   (parasympathetic nervous system). Effect: Relaxes the bladder to decrease overactivity, increase bladder storage capacity, and increase time between voids. Onset: Slow acting (may take 8-12 weeks to determine efficacy). Medications include: Vesicare  (Solifenacin), Ditropan (Oxybutynin), Detrol (Tolterodine), Toviaz (Fesoterodine), Sanctura (Trospium), Urispas (Flavoxate), Enablex (Darifenacin), Bentyl (Dicyclomine), Levsin (Hyoscyamine ). Potential side effects include but are not limited to: Dry eyes, dry mouth, constipation, cognitive impairment, dementia risk with long term use, and urinary retention/ incomplete bladder emptying. Insurance companies generally prefer for patients to try 1-2 anticholinergic / antimuscarinic medications first due to low cost. Some exceptions are made based on patient-specific comorbidities / risk factors. Beta-3 agonist medications: Mechanism of action: Stimulates selective B3 adrenergic receptors to cause smooth muscle bladder relaxation (sympathetic nervous system). Effect: Relaxes the bladder to decrease overactivity, increase bladder storage capacity, and increase time between voids. Onset: Slow acting (may take 8-12 weeks to determine efficacy). Medications include: Myrbetriq (Mirabegron) and Vibegron Leslye Peer). Potential side effects include but are not limited to: urinary retention / incomplete bladder emptying and elevated blood pressure (more likely to occur in individuals with pre-existing uncontrolled hypertension). These medications tend to be more expensive than the anticholinergic / antimuscarinic medications.   For patients with refractory OAB (if the above treatment options have been unsuccessful): Posterior tibial nerve stimulation (PTNS). Small acupuncture-type needle inserted near ankle with electric  current to stimulate bladder via posterior tibial nerve pathway. Initially requires 12 weekly in-office treatments lasting 30 minutes each; followed by monthly in-office treatments lasting 30 minutes each for 1 year.  Bladder Botox injections. How it is done: Typically done via in-office cystoscopy; sometimes done in the OR depending on the situation. The bladder is numbed with lidocaine instilled  via a catheter. Then the urologist injects Botox into the bladder muscle wall in about 20 locations. Causes local paralysis of the bladder muscle at the injection sites to reduce bladder muscle overactivity / spasms. The effect lasts for approximately 6 months and cannot be reversed once performed. Risks may included but are not limited to: infection, incomplete bladder emptying/ urinary retention, short term need for self-catheterization or indwelling catheter, and need for repeat therapy. There is a 5-12% chance of needing to catheterize with Botox - that usually resolves in a few months as the Botox wears off. Typically Botox injections would need to be repeated every 3-12 months since this is not a permanent therapy.  Sacral neuromodulation trial (Medtronic lnterStim or Axonics implant). Sacral neuromodulation is FDA-approved for uncontrolled urinary urgency, urinary frequency, urinary urge incontinence, non-obstructive urinary retention, or fecal incontinence. It is not FDA-approved as a treatment for pain. The goal of this therapy is at least a 50% improvement in symptoms. It is NOT realistic to expect a 100% cure. This is a a 2-step outpatient procedure. After a successful test period, a permanent wire and generator are placed in the OR. We discussed the risk of infection. We reviewed the fact that about 30% of patients fail the test phase and are not candidates for permanent generator placement. During the 1-2 week trial phase, symptoms are documented by the patient to determine response. If patient gets at least a 50% improvement in symptoms, they may then proceed with Step 2. Step 1: Trial lead placement. Per physician discretion, may done one of two ways: Percutaneous nerve evaluation (PNE) in the [facility] urology office. Performed by urologist under local anesthesia (numbing the area with lidocaine) using a spinal needle for placement of test wire, which usually stays in place for 5-7 days to  determine therapy response. Test lead placement in OR under anesthesia. Usually stays in place 2 weeks to determine therapy response. > Step 2: Permanent implantation of sacral neuromodulation device, which is performed in the OR.  Sacral neuromodulation implants: All are conditionally MRI safe. Manufacturer: Medtronic Website: [employer] therapy/right-for-you.html Options: lnterStim X: Non-rechargeable. The battery lasts 10 years on average. lnterStim Micro: Rechargeable. The battery lasts 15 years on average and must be charged routinely. Approximately 50% smaller implant than lnterStim X implant.  Manufacturer: Axonics Website: Findrealrelief.axonics.com Options: Non-rechargeable (Axonics F15): The battery lasts 15 years on average. Rechargeable (Axonics R20): The battery lasts 20 years on average and must be charged in office for about 1 hour every 6-10 months on average. Approximately 50% smaller implant than Axonics non-rechargeable implant.  Note: Generally the rechargeable devices are only advised for very small or thin patients who may not have sufficient adipose tissue to comfortably overlay the implanted device.  Suprapubic catheter (SP tube) placement. Only done in severely refractory OAB when all other options have failed or are not a viable treatment choice depending on patient factors. Involves placement of a catheter through the lower abdomen into the bladder to continuously drain the bladder into an external collection bag, which patient can then empty at their convenience every few hours. Done via an outpatient surgical procedure in  the OR under anesthesia. Risks may included but are not limited to: surgical site pain, infections, skin irritation / breakdown, chronic bacteriuria, symptomatic UTls. The SP tube must stay in place continuously. This is a reversible procedure however - the  insertion site will close if catheter is removed for more than a few hours. The SP tube must be exchanged routinely every 4 weeks to prevent the catheter from becoming clogged with sediment. SP tube exchanges are typically performed at a urology nurse visit or by a home health nurse.            Electronically signed by:  Donnita Falls, MSN, FNP-C, CUNP 01/04/2023 10:59 AM

## 2023-01-04 ENCOUNTER — Ambulatory Visit: Payer: BC Managed Care – PPO | Admitting: Urology

## 2023-01-04 ENCOUNTER — Ambulatory Visit (HOSPITAL_COMMUNITY)
Admission: RE | Admit: 2023-01-04 | Discharge: 2023-01-04 | Disposition: A | Discharge status: Home / Self Care | Payer: BC Managed Care – PPO | Source: Ambulatory Visit | Attending: Family Medicine | Admitting: Family Medicine

## 2023-01-04 ENCOUNTER — Encounter: Payer: Self-pay | Admitting: Urology

## 2023-01-04 ENCOUNTER — Encounter (HOSPITAL_COMMUNITY): Payer: Self-pay

## 2023-01-04 VITALS — BP 122/77 | HR 81 | Temp 98.3°F

## 2023-01-04 DIAGNOSIS — N63 Unspecified lump in unspecified breast: Secondary | ICD-10-CM | POA: Diagnosis present

## 2023-01-04 DIAGNOSIS — R351 Nocturia: Secondary | ICD-10-CM | POA: Diagnosis not present

## 2023-01-04 DIAGNOSIS — N3941 Urge incontinence: Secondary | ICD-10-CM

## 2023-01-04 DIAGNOSIS — N3281 Overactive bladder: Secondary | ICD-10-CM | POA: Diagnosis not present

## 2023-01-04 LAB — URINALYSIS, ROUTINE W REFLEX MICROSCOPIC
Bilirubin, UA: NEGATIVE
Glucose, UA: NEGATIVE
Ketones, UA: NEGATIVE
Leukocytes,UA: NEGATIVE
Nitrite, UA: NEGATIVE
RBC, UA: NEGATIVE
Specific Gravity, UA: 1.03 (ref 1.005–1.030)
Urobilinogen, Ur: 1 mg/dL (ref 0.2–1.0)
pH, UA: 6 (ref 5.0–7.5)

## 2023-01-04 LAB — BLADDER SCAN AMB NON-IMAGING: Scan Result: 0

## 2023-01-04 NOTE — Progress Notes (Signed)
post void residual=0 ?

## 2023-01-04 NOTE — Patient Instructions (Addendum)
Overactive bladder (OAB) overview for patients:  Symptoms may include: urinary urgency ("gotta go" feeling) urinary frequency (voiding >8 times per day) night time urination (nocturia) urge incontinence of urine (UUI)  While we do not know the exact etiology of OAB, several treatment options exist including:  Behavioral therapy: Reducing fluid intake Decreasing bladder stimulants (such as caffeine) and irritants (such as acidic food, spicy foods, alcohol) Urge suppression strategies Bladder retraining via timed voiding  Pelvic floor physical therapy  Medication(s) - can use one or both of the drug classes below. Anticholinergic / antimuscarinic medications:  Mechanism of action: Activate M3 receptors to reduce detrusor stimulation and increase bladder capacity  (parasympathetic nervous system). Effect: Relaxes the bladder to decrease overactivity, increase bladder storage capacity, and increase time between voids. Onset: Slow acting (may take 8-12 weeks to determine efficacy). Medications include: Vesicare (Solifenacin), Ditropan (Oxybutynin), Detrol (Tolterodine), Toviaz (Fesoterodine), Sanctura (Trospium), Urispas (Flavoxate), Enablex (Darifenacin), Bentyl (Dicyclomine), Levsin (Hyoscyamine ). Potential side effects include but are not limited to: Dry eyes, dry mouth, constipation, cognitive impairment, dementia risk with long term use, and urinary retention/ incomplete bladder emptying. Insurance companies generally prefer for patients to try 1-2 anticholinergic / antimuscarinic medications first due to low cost. Some exceptions are made based on patient-specific comorbidities / risk factors. Beta-3 agonist medications: Mechanism of action: Stimulates selective B3 adrenergic receptors to cause smooth muscle bladder relaxation (sympathetic nervous system). Effect: Relaxes the bladder to decrease overactivity, increase bladder storage capacity, and increase time  between voids. Onset: Slow acting (may take 8-12 weeks to determine efficacy). Medications include: Myrbetriq (Mirabegron) and Vibegron Brandi Young). Potential side effects include but are not limited to: urinary retention / incomplete bladder emptying and elevated blood pressure (more likely to occur in individuals with pre-existing uncontrolled hypertension). These medications tend to be more expensive than the anticholinergic / antimuscarinic medications.   For patients with refractory OAB (if the above treatment options have been unsuccessful): Posterior tibial nerve stimulation (PTNS). Small acupuncture-type needle inserted near ankle with electric current to stimulate bladder via posterior tibial nerve pathway. Initially requires 12 weekly in-office treatments lasting 30 minutes each; followed by monthly in-office treatments lasting 30 minutes each for 1 year.  Bladder Botox injections. How it is done: Typically done via in-office cystoscopy; sometimes done in the OR depending on the situation. The bladder is numbed with lidocaine instilled via a catheter. Then the urologist injects Botox into the bladder muscle wall in about 20 locations. Causes local paralysis of the bladder muscle at the injection sites to reduce bladder muscle overactivity / spasms. The effect lasts for approximately 6 months and cannot be reversed once performed. Risks may included but are not limited to: infection, incomplete bladder emptying/ urinary retention, short term need for self-catheterization or indwelling catheter, and need for repeat therapy. There is a 5-12% chance of needing to catheterize with Botox - that usually resolves in a few months as the Botox wears off. Typically Botox injections would need to be repeated every 3-12 months since this is not a permanent therapy.  Sacral neuromodulation trial (Medtronic lnterStim or Axonics implant). Sacral neuromodulation is FDA-approved for uncontrolled urinary  urgency, urinary frequency, urinary urge incontinence, non-obstructive urinary retention, or fecal incontinence. It is not FDA-approved as a treatment for pain. The goal of this therapy is at least a 50% improvement in symptoms. It is NOT realistic to expect a 100% cure. This is a a 2-step outpatient procedure. After a successful  test period, a permanent wire and generator are placed in the OR. We discussed the risk of infection. We reviewed the fact that about 30% of patients fail the test phase and are not candidates for permanent generator placement. During the 1-2 week trial phase, symptoms are documented by the patient to determine response. If patient gets at least a 50% improvement in symptoms, they may then proceed with Step 2. Step 1: Trial lead placement. Per physician discretion, may done one of two ways: Percutaneous nerve evaluation (PNE) in the [facility] urology office. Performed by urologist under local anesthesia (numbing the area with lidocaine) using a spinal needle for placement of test wire, which usually stays in place for 5-7 days to determine therapy response. Test lead placement in OR under anesthesia. Usually stays in place 2 weeks to determine therapy response. > Step 2: Permanent implantation of sacral neuromodulation device, which is performed in the OR.  Sacral neuromodulation implants: All are conditionally MRI safe. Manufacturer: Medtronic Website: [employer]gl therapy/right-for-you.html Options: lnterStim X: Non-rechargeable. The battery lasts 10 years on average. lnterStim Micro: Rechargeable. The battery lasts 15 years on average and must be charged routinely. Approximately 50% smaller implant than lnterStim X implant.  Manufacturer: Axonics Website: Findrealrelief.axonics.com Options: Non-rechargeable (Axonics F15): The battery lasts 15 years on average. Rechargeable (Axonics R20):  The battery lasts 20 years on average and must be charged in office for about 1 hour every 6-10 months on average. Approximately 50% smaller implant than Axonics non-rechargeable implant.  Note: Generally the rechargeable devices are only advised for very small or thin patients who may not have sufficient adipose tissue to comfortably overlay the implanted device.  Suprapubic catheter (SP tube) placement. Only done in severely refractory OAB when all other options have failed or are not a viable treatment choice depending on patient factors. Involves placement of a catheter through the lower abdomen into the bladder to continuously drain the bladder into an external collection bag, which patient can then empty at their convenience every few hours. Done via an outpatient surgical procedure in the OR under anesthesia. Risks may included but are not limited to: surgical site pain, infections, skin irritation / breakdown, chronic bacteriuria, symptomatic UTls. The SP tube must stay in place continuously. This is a reversible procedure however - the insertion site will close if catheter is removed for more than a few hours. The SP tube must be exchanged routinely every 4 weeks to prevent the catheter from becoming clogged with sediment. SP tube exchanges are typically performed at a urology nurse visit or by a home health nurse.

## 2023-01-18 ENCOUNTER — Ambulatory Visit: Payer: BC Managed Care – PPO | Admitting: Urology

## 2023-02-14 ENCOUNTER — Ambulatory Visit
Admission: EM | Admit: 2023-02-14 | Discharge: 2023-02-14 | Disposition: A | Discharge status: Home / Self Care | Payer: BC Managed Care – PPO | Attending: Nurse Practitioner | Admitting: Nurse Practitioner

## 2023-02-14 DIAGNOSIS — J069 Acute upper respiratory infection, unspecified: Secondary | ICD-10-CM | POA: Insufficient documentation

## 2023-02-14 DIAGNOSIS — J029 Acute pharyngitis, unspecified: Secondary | ICD-10-CM | POA: Diagnosis not present

## 2023-02-14 LAB — POCT RAPID STREP A (OFFICE): Rapid Strep A Screen: NEGATIVE

## 2023-02-14 LAB — POC COVID19/FLU A&B COMBO
Covid Antigen, POC: NEGATIVE
Influenza A Antigen, POC: NEGATIVE
Influenza B Antigen, POC: NEGATIVE

## 2023-02-14 MED ORDER — PSEUDOEPH-BROMPHEN-DM 30-2-10 MG/5ML PO SYRP
5.0000 mL | ORAL_SOLUTION | Freq: Four times a day (QID) | ORAL | 0 refills | Status: AC | PRN
Start: 2023-02-14 — End: ?

## 2023-02-14 MED ORDER — CETIRIZINE HCL 10 MG PO TABS: 10.0000 mg | ORAL_TABLET | Freq: Every day | ORAL | 0 refills | Status: DC

## 2023-02-14 MED ORDER — FLUTICASONE PROPIONATE 50 MCG/ACT NA SUSP: 2.0000 | Freq: Every day | NASAL | 0 refills | Status: DC

## 2023-02-14 NOTE — ED Provider Notes (Signed)
[facility]    CSN: [id number] Arrival date & time: 02/14/23  1557      History   Chief Complaint No chief complaint on file.   HPI Brandi Young is a 52 y.o. female.   The history is provided by the patient.    Patient for complaints of fever, cough, sore throat, headache, nausea, and fatigue that started over the past several days.  Tmax around 102.  Patient denies chills, ear pain, wheezing, difficulty breathing, chest pain, abdominal pain, nausea, vomiting, diarrhea, or rash.  Patient reports her family member was diagnosed with pneumonia and son has the same or similar symptoms.  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  Reports she has taken Tylenol and Robitussin for her symptoms.  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  LOW RISK SCAN   US ECHOCARDIOGRAPHY   10/11/2008   Normal   VAGINAL HYSTERECTOMY  09/16/2011   Procedure: HYSTERECTOMY VAGINAL;  Surgeon: Meriel Pica, MD;  Location: [facility];  Service: Gynecology;  Laterality: N/A;   WISDOM TOOTH EXTRACTION      OB History   No obstetric history on file.      Home Medications    Prior to Admission medications   Medication Sig Start Date End Date Taking? Authorizing Provider  brompheniramine-pseudoephedrine-DM 30-2-10 MG/5ML syrup Take 5 mLs by mouth 4 (four) times daily as needed. 02/14/23  Yes Leath-Warren, Sadie Haber, NP  cetirizine (ZYRTEC) 10 MG tablet Take 1 tablet (10 mg total) by mouth daily. 02/14/23  Yes Leath-Warren, Sadie Haber, NP  fluticasone (FLONASE) 50 MCG/ACT nasal spray Place 2 sprays into both nostrils daily. 02/14/23  Yes Leath-Warren, Sadie Haber, NP  ALPRAZolam Prudy Feeler) 0.5 MG tablet Take 0.5 mg by mouth 3 (three) times daily as needed for sleep or anxiety.    [provider]  amLODipine (NORVASC) 10 MG tablet Take 10 mg by mouth daily.    [provider]  aspirin 81 MG tablet Take 81 mg by mouth daily.    [provider]  buPROPion (WELLBUTRIN) 100 MG tablet Take 100 mg by mouth 2 (two) times daily.    [provider]  dicyclomine (BENTYL) 20 MG tablet Take 1 tablet (20 mg total) by mouth 2 (two) times daily. 09/12/19   Rennis Harding, PA-C  esomeprazole (NEXIUM) 20 MG capsule Take 20 mg by mouth daily.    [provider]  FLUoxetine (PROZAC) 20 MG tablet Take 20 mg by mouth daily.    [provider]  levothyroxine (LEVOXYL) 125 MCG tablet Take 125 mcg by mouth once a week.    [provider]  levothyroxine (SYNTHROID) 150 MCG tablet 150 mcg daily. Mon-Sat    [provider]  Liraglutide -Weight Management (SAXENDA ) Inject into the skin.    [provider]  losartan-hydrochlorothiazide (HYZAAR) 100-25 MG tablet Take 1 tablet by mouth daily. 12/23/21   [provider]   metFORMIN (GLUCOPHAGE) 500 MG tablet Take 1,000 mg by mouth daily with breakfast.    [provider]  traZODone (DESYREL) 50 MG tablet Take 150 mg by mouth.    [provider]  TRINTELLIX 20 MG TABS tablet Take 20 mg by mouth daily.    [provider]  hydrochlorothiazide (MICROZIDE) 12.5 MG capsule Take 1 capsule (12.5 mg total) by mouth daily. Need appointment before further refills 06/05/14 09/12/19  Lennette Bihari, MD  metoprolol succinate (TOPROL-XL) 100 MG 24 hr tablet Take 1 tablet (100 mg total) by mouth daily. Take with or immediately following a meal. 06/05/14 09/12/19  Lennette Bihari, MD    Family History Family History  Problem Relation Age of Onset   Hyperlipidemia Mother    Hypertension Mother    Heart disease Mother    Heart disease Father    Diabetes Father    Hyperlipidemia Father     Social History Social History   Tobacco Use   Smoking status: Never   Smokeless tobacco: Never  Substance Use Topics   Alcohol use: No   Drug use: No     Allergies   Patient has no known allergies.   Review of Systems Review of Systems Per HPI  Physical Exam Triage Vital Signs ED Triage Vitals  Encounter Vitals Group     BP 02/14/23 1743 (!) 147/84     Systolic BP Percentile --      Diastolic BP Percentile --      Pulse Rate 02/14/23 1743 77     Resp 02/14/23 1743 15     Temp 02/14/23 1743 98.1 F (36.7 C)     Temp Source 02/14/23 1743 Oral     SpO2 02/14/23 1743 97 %     Weight --      Height --      Head Circumference --      Peak Flow --      Pain Score 02/14/23 1745 7     Pain Loc --      Pain Education --      Exclude from Growth Chart --    No data found.  Updated Vital Signs BP (!) 147/84 (BP Location: Right Arm)   Pulse 77   Temp 98.1 F (36.7 C) (Oral)   Resp 15   LMP 08/19/2011   SpO2 97%   Visual Acuity Right Eye Distance:   Left Eye Distance:   Bilateral Distance:    Right Eye Near:   Left Eye Near:     Bilateral Near:     Physical Exam Vitals and nursing note reviewed.  Constitutional:      General: She is not in acute distress.    Appearance: Normal appearance.  HENT:     Head: Normocephalic.     Right Ear: Tympanic membrane, ear canal and external ear normal.  Left Ear: Tympanic membrane, ear canal and external ear normal.     Nose: Congestion present.     Right Turbinates: Enlarged and swollen.     Left Turbinates: Enlarged and swollen.     Right Sinus: No maxillary sinus tenderness or frontal sinus tenderness.     Left Sinus: No maxillary sinus tenderness or frontal sinus tenderness.     Mouth/Throat:     Lips: Pink.     Mouth: Mucous membranes are moist.     Pharynx: Uvula midline. Pharyngeal swelling, posterior oropharyngeal erythema and postnasal drip present. No oropharyngeal exudate or uvula swelling.     Tonsils: 1+ on the right. 1+ on the left.     Comments: Cobblestoning present to posterior oropharynx  Eyes:     Extraocular Movements: Extraocular movements intact.     Conjunctiva/sclera: Conjunctivae normal.     Pupils: Pupils are equal, round, and reactive to light.  Cardiovascular:     Rate and Rhythm: Normal rate and regular rhythm.     Pulses: Normal pulses.     Heart sounds: Normal heart sounds.  Pulmonary:     Effort: Pulmonary effort is normal. No respiratory distress.     Breath sounds: Normal breath sounds. No stridor. No wheezing, rhonchi or rales.  Abdominal:     General: Bowel sounds are normal.     Palpations: Abdomen is soft.     Tenderness: There is no abdominal tenderness.  Musculoskeletal:     Cervical back: Normal range of motion.  Lymphadenopathy:     Cervical: No cervical adenopathy.  Skin:    General: Skin is warm and dry.  Neurological:     General: No focal deficit present.     Mental Status: She is alert and oriented to person, place, and time.  Psychiatric:        Mood and Affect: Mood normal.        Behavior: Behavior  normal.      UC Treatments / Results  Labs (all labs ordered are listed, but only abnormal results are displayed) Labs Reviewed  CULTURE, GROUP A STREP ([facility])  POC COVID19/FLU A&B COMBO  POCT RAPID STREP A (OFFICE)    EKG   Radiology No results found.  Procedures Procedures (including critical care time)  Medications Ordered in UC Medications - No data to display  Initial Impression / Assessment and Plan / UC Course  I have reviewed the triage vital signs and the nursing notes.  Pertinent labs & imaging results that were available during my care of the patient were reviewed by me and considered in my medical decision making (see chart for details).  COVID/influenza test and rapid strep test were negative.  Throat culture is pending.  Suspect a viral upper respiratory infection with cough as patient's lung sounds are clear throughout, room air sats are at 98%.  Will provide symptomatic treatment with Bromfed-DM for the cough, fluticasone 50 micro nasal spray for nasal congestion, and Zyrtec as an antihistamine.  Supportive care recommendations were provided and discussed with the patient to include fluids, rest, over-the-counter analgesics, normal saline nasal spray, and use of a humidifier at nighttime during sleep.  Discussed indications with the patient regarding when follow-up be indicated.  Patient was in agreement with this plan of care and verbalized understanding.  All questions were answered.  Patient stable for discharge.  Work note was provided.  Final Clinical Impressions(s) / UC Diagnoses   Final diagnoses:  Sore throat  Viral upper respiratory  tract infection with cough     Discharge Instructions      The COVID/flu test and rapid strep test were negative.  A throat culture is pending.  You will be contacted if the pending test result is abnormal.  You also have access to the results via MyChart. Take medication as directed. Increase fluids and get plenty of  rest. May take over-the-counter ibuprofen or Tylenol as needed for pain, fever, or general discomfort. Recommend normal saline nasal spray to help with nasal congestion throughout the day. Warm salt water gargles 3-4 times daily or as needed for throat pain or discomfort.   Also recommend a soft diet while throat pain persist. For your cough, it may be helpful to use a humidifier at bedtime during sleep. If symptoms do not improve with this treatment, you may follow-up in this clinic or with his primary care physician for further evaluation. Follow-up as needed.     ED Prescriptions     Medication Sig Dispense Auth. Provider   brompheniramine-pseudoephedrine-DM 30-2-10 MG/5ML syrup Take 5 mLs by mouth 4 (four) times daily as needed. 140 mL Leath-Warren, Sadie Haber, NP   fluticasone (FLONASE) 50 MCG/ACT nasal spray Place 2 sprays into both nostrils daily. 16 g Leath-Warren, Sadie Haber, NP   cetirizine (ZYRTEC) 10 MG tablet Take 1 tablet (10 mg total) by mouth daily. 30 tablet Leath-Warren, Sadie Haber, NP      PDMP not reviewed this encounter.   Abran Cantor, NP 02/14/23 1905

## 2023-02-14 NOTE — Discharge Instructions (Addendum)
The COVID/flu test and rapid strep test were negative.  A throat culture is pending.  You will be contacted if the pending test result is abnormal.  You also have access to the results via MyChart. Take medication as directed. Increase fluids and get plenty of rest. May take over-the-counter ibuprofen or Tylenol as needed for pain, fever, or general discomfort. Recommend normal saline nasal spray to help with nasal congestion throughout the day. Warm salt water gargles 3-4 times daily or as needed for throat pain or discomfort.   Also recommend a soft diet while throat pain persist. For your cough, it may be helpful to use a humidifier at bedtime during sleep. If symptoms do not improve with this treatment, you may follow-up in this clinic or with his primary care physician for further evaluation. Follow-up as needed.

## 2023-02-14 NOTE — ED Triage Notes (Signed)
Pt reports  Thursday dry cough, nasal congestion, Saturday headache, chest congestion, sore throat, fever, chill fatigue

## 2023-02-17 LAB — CULTURE, GROUP A STREP (THRC)

## 2023-07-05 ENCOUNTER — Encounter: Payer: Self-pay | Admitting: Urology

## 2023-07-05 ENCOUNTER — Ambulatory Visit: Payer: BC Managed Care – PPO | Admitting: Urology

## 2023-07-05 VITALS — BP 113/76 | HR 91

## 2023-07-05 DIAGNOSIS — R351 Nocturia: Secondary | ICD-10-CM | POA: Diagnosis not present

## 2023-07-05 DIAGNOSIS — N3941 Urge incontinence: Secondary | ICD-10-CM

## 2023-07-05 DIAGNOSIS — N3281 Overactive bladder: Secondary | ICD-10-CM | POA: Insufficient documentation

## 2023-07-05 DIAGNOSIS — N3946 Mixed incontinence: Secondary | ICD-10-CM | POA: Diagnosis not present

## 2023-07-05 LAB — BLADDER SCAN AMB NON-IMAGING: Scan Result: 0

## 2023-07-05 NOTE — Progress Notes (Signed)
 Name: Brandi Young DOB: 1970-12-26 MRN: [id number]  History of Present Illness: Brandi Young is a 53 y.o. female who presents today for follow up visit at [facility].  GU History includes: 1. OAB with urinary frequency (x8-10/day), nocturia (x1-2/night), urgency, and urge incontinence. 2. Mixed urinary incontinence (urge predominant).  At initial visit on 01/04/2023: > PVR = 0 ml. > Reported mild caffeine intake (1-2 caffeinated beverages per day on average)  > The plan was:  - For OAB with UUI: 1. Minimize caffeine intake. 2. Work on timed voiding. 3. Double/ triple voiding. - For SUI:  4. Weight loss. - Return in about 6 months (around 07/04/2023) for UA, PVR, & f/u with Griselda Lederer NP.  Today: She reports symptomatic improvement since working on behavioral modifications, primarily responding in a timely manner to her body's cues that it's time to void. Reports decreased urinary frequency, nocturia, and urge incontinence. She reports occasional urinary urgency and occasional straining to void.    Medications: Current Outpatient Medications  Medication Sig Dispense Refill   ALPRAZolam (XANAX) 0.5 MG tablet Take 0.5 mg by mouth 3 (three) times daily as needed for sleep or anxiety.     amLODipine  (NORVASC ) 10 MG tablet Take 10 mg by mouth daily.     brompheniramine-pseudoephedrine-DM 30-2-10 MG/5ML syrup Take 5 mLs by mouth 4 (four) times daily as needed. 140 mL 0   esomeprazole (NEXIUM) 20 MG capsule Take 20 mg by mouth daily.     levothyroxine  (LEVOXYL ) 125 MCG tablet Take 125 mcg by mouth once a week.     levothyroxine  (SYNTHROID ) 150 MCG tablet 150 mcg daily. Mon-Sat     losartan-hydrochlorothiazide  (HYZAAR) 100-25 MG tablet Take 1 tablet by mouth daily.     metFORMIN  (GLUCOPHAGE ) 500 MG tablet Take 1,000 mg by mouth daily with breakfast.     traZODone (DESYREL) 50 MG tablet Take 150 mg by mouth.     TRINTELLIX 20 MG TABS tablet Take 20 mg by mouth  daily.     No current facility-administered medications for this visit.    Allergies: No Known Allergies  Past Medical History:  Diagnosis Date   Anxiety    Depression    Hypertension    Hypothyroidism    Metabolic syndrome    Probable   SVD (spontaneous vaginal delivery)    x 1   Thyroid disease    Past Surgical History:  Procedure Laterality Date   DILATION AND CURETTAGE OF UTERUS  2011   NASAL SINUS SURGERY  2003   NM MYOCAR PERF WALL MOTION  10/11/2008   perfusion defect c/w breast artifact, though region of ischemia can not be excluded.  LOW RISK SCAN   US  ECHOCARDIOGRAPHY  10/11/2008   Normal   VAGINAL HYSTERECTOMY  09/16/2011   Procedure: HYSTERECTOMY VAGINAL;  Surgeon: Piedad Brewer, MD;  Location: [facility];  Service: Gynecology;  Laterality: N/A;   WISDOM TOOTH EXTRACTION     Family History  Problem Relation Age of Onset   Hyperlipidemia Mother    Hypertension Mother    Heart disease Mother    Heart disease Father    Diabetes Father    Hyperlipidemia Father    Social History   Socioeconomic History   Marital status: Married    Spouse name: Not on file   Number of children: Not on file   Years of education: Not on file   Highest education level: Not on file  Occupational History   Not  on file  Tobacco Use   Smoking status: Never   Smokeless tobacco: Never  Substance and Sexual Activity   Alcohol use: No   Drug use: No   Sexual activity: Yes    Birth control/protection: None  Other Topics Concern   Not on file  Social History Narrative   Not on file   Social Drivers of Health   Financial Resource Strain: Not on file  Food Insecurity: Not on file  Transportation Needs: Not on file  Physical Activity: Not on file  Stress: Not on file  Social Connections: Not on file  Intimate Partner Violence: Not on file    Review of Systems Constitutional: Patient denies any unintentional weight loss or change in strength lntegumentary: Patient denies  any rashes or pruritus Cardiovascular: Patient denies chest pain or syncope Respiratory: Patient denies shortness of breath Musculoskeletal: Patient denies muscle cramps or weakness Neurologic: Patient denies convulsions or seizures Allergic/Immunologic: Patient denies recent allergic reaction(s) Hematologic/Lymphatic: Patient denies bleeding tendencies Endocrine: Patient denies heat/cold intolerance  GU: As per HPI.  OBJECTIVE Vitals:   07/05/23 1546  BP: 113/76  Pulse: 91   There is no height or weight on file to calculate BMI.  Physical Examination Constitutional: No obvious distress; patient is non-toxic appearing  Cardiovascular: No visible lower extremity edema.  Respiratory: The patient does not have audible wheezing/stridor; respirations do not appear labored  Gastrointestinal: Abdomen non-distended Musculoskeletal: Normal ROM of UEs  Skin: No obvious rashes/open sores  Neurologic: CN 2-12 grossly intact Psychiatric: Answered questions appropriately with normal affect  Hematologic/Lymphatic/Immunologic: No obvious bruises or sites of spontaneous bleeding  UA: negative  PVR: 0 ml  ASSESSMENT OAB (overactive bladder) - Plan: Urinalysis, Routine w reflex microscopic, BLADDER SCAN AMB NON-IMAGING  Urge incontinence - Plan: Urinalysis, Routine w reflex microscopic, BLADDER SCAN AMB NON-IMAGING  Nocturia - Plan: Urinalysis, Routine w reflex microscopic, BLADDER SCAN AMB NON-IMAGING  Mixed stress and urge urinary incontinence - Plan: Urinalysis, Routine w reflex microscopic, BLADDER SCAN AMB NON-IMAGING  OAB well managed with behavioral modifications at this time. We agreed to follow up on an as-needed basis. Pt verbalized understanding and agreement. All questions were answered.   PLAN Advised the following: 1. Return if symptoms worsen or fail to improve.  Orders Placed This Encounter  Procedures   Urinalysis, Routine w reflex microscopic   BLADDER SCAN AMB  NON-IMAGING    It has been explained that the patient is to follow regularly with their PCP in addition to all other providers involved in their care and to follow instructions provided by these respective offices. Patient advised to contact urology clinic if any urologic-pertaining questions, concerns, new symptoms or problems arise in the interim period.  There are no Patient Instructions on file for this visit.  Electronically signed by:  Lauretta Ponto, FNP   07/05/23    4:11 PM

## 2023-07-06 LAB — URINALYSIS, ROUTINE W REFLEX MICROSCOPIC
Bilirubin, UA: NEGATIVE
Glucose, UA: NEGATIVE
Ketones, UA: NEGATIVE
Leukocytes,UA: NEGATIVE
Nitrite, UA: NEGATIVE
Protein,UA: NEGATIVE
RBC, UA: NEGATIVE
Specific Gravity, UA: 1.03 (ref 1.005–1.030)
Urobilinogen, Ur: 0.2 mg/dL (ref 0.2–1.0)
pH, UA: 6 (ref 5.0–7.5)

## 2023-09-19 ENCOUNTER — Ambulatory Visit
Admission: EM | Admit: 2023-09-19 | Discharge: 2023-09-19 | Disposition: A | Discharge status: Home / Self Care | Attending: Family Medicine | Admitting: Family Medicine

## 2023-09-19 DIAGNOSIS — H1032 Unspecified acute conjunctivitis, left eye: Secondary | ICD-10-CM | POA: Diagnosis not present

## 2023-09-19 DIAGNOSIS — L989 Disorder of the skin and subcutaneous tissue, unspecified: Secondary | ICD-10-CM

## 2023-09-19 MED ORDER — POLYMYXIN B-TRIMETHOPRIM 10000-0.1 UNIT/ML-% OP SOLN
1.0000 [drp] | Freq: Four times a day (QID) | OPHTHALMIC | 0 refills | Status: AC
Start: 2023-09-19 — End: ?

## 2023-09-19 NOTE — ED Triage Notes (Signed)
 Pt reports possible pink eye, in the pain dryness discomfort the the eye crusts over in the mornings started in the left eye and has carried over to the right eye.

## 2023-09-19 NOTE — Discharge Instructions (Signed)
 With the areas on your finger, try the Compound W gel in case this is an early warty place.  This may also represent a callus.  Follow-up for worsening or unresolving symptoms

## 2023-09-20 NOTE — ED Provider Notes (Signed)
 [facility]    CSN: [id number] Arrival date & time: 09/19/23  1043      History   Chief Complaint No chief complaint on file.   HPI Brandi Young is a 53 y.o. female.   Patient presenting today with several day history of left eye irritation, redness, drainage and crusting that is now starting in the right eye as well.  Denies visual change, headache, nausea, vomiting, known injury to the eye, chemical exposures.  Trying over-the-counter drops with no relief.    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  LOW RISK SCAN   US  ECHOCARDIOGRAPHY  10/11/2008   Normal   VAGINAL HYSTERECTOMY  09/16/2011   Procedure: HYSTERECTOMY VAGINAL;  Surgeon: Charlie CHRISTELLA Croak, MD;  Location: [facility];  Service: Gynecology;   Laterality: N/A;   WISDOM TOOTH EXTRACTION      OB History   No obstetric history on file.      Home Medications    Prior to Admission medications   Medication Sig Start Date End Date Taking? Authorizing Provider  trimethoprim -polymyxin b  (POLYTRIM ) ophthalmic solution Place 1 drop into the left eye every 6 (six) hours. 09/19/23  Yes Stuart Vernell Norris, PA-C  ALPRAZolam (XANAX) 0.5 MG tablet Take 0.5 mg by mouth 3 (three) times daily as needed for sleep or anxiety.    [provider]  amLODipine  (NORVASC ) 10 MG tablet Take 10 mg by mouth daily.    [provider]  brompheniramine-pseudoephedrine-DM 30-2-10 MG/5ML syrup Take 5 mLs by mouth 4 (four) times daily as needed. 02/14/23   Leath-Warren, Etta PARAS, NP  esomeprazole (NEXIUM) 20 MG capsule Take 20 mg by mouth daily.    [provider]  levothyroxine  (LEVOXYL ) 125 MCG tablet Take 125 mcg by mouth once a week.    [provider]  levothyroxine  (SYNTHROID ) 150 MCG tablet 150 mcg daily. Mon-Sat    [provider]  losartan-hydrochlorothiazide  (HYZAAR) 100-25 MG tablet Take 1 tablet by mouth daily. 12/23/21   [provider]  metFORMIN  (GLUCOPHAGE ) 500 MG tablet Take 1,000 mg by mouth daily with breakfast.    [provider]  traZODone (DESYREL) 50 MG tablet Take 150 mg by mouth.    [provider]  TRINTELLIX 20 MG TABS  tablet Take 20 mg by mouth daily.    [provider]  hydrochlorothiazide  (MICROZIDE ) 12.5 MG capsule Take 1 capsule (12.5 mg total) by mouth daily. Need appointment before further refills 06/05/14 09/12/19  Burnard Debby LABOR, MD  metoprolol  succinate (TOPROL -XL) 100 MG 24 hr tablet Take 1 tablet (100 mg total) by mouth daily. Take with or immediately following a meal. 06/05/14 09/12/19  Burnard Debby LABOR, MD    Family History Family History  Problem Relation Age of Onset   Hyperlipidemia Mother    Hypertension Mother    Heart disease Mother     Heart disease Father    Diabetes Father    Hyperlipidemia Father     Social History Social History   Tobacco Use   Smoking status: Never   Smokeless tobacco: Never  Substance Use Topics   Alcohol use: No   Drug use: No     Allergies   Augmentin [amoxicillin-pot clavulanate]   Review of Systems Review of Systems Per HPI  Physical Exam Triage Vital Signs ED Triage Vitals  Encounter Vitals Group     BP 09/19/23 1120 117/71     Girls Systolic BP Percentile --      Girls Diastolic BP Percentile --      Boys Systolic BP Percentile --      Boys Diastolic BP Percentile --      Pulse Rate 09/19/23 1120 84     Resp 09/19/23 1120 18     Temp 09/19/23 1120 98.2 F (36.8 C)     Temp Source 09/19/23 1120 Oral     SpO2 09/19/23 1120 97 %     Weight --      Height --      Head Circumference --      Peak Flow --      Pain Score 09/19/23 1121 5     Pain Loc --      Pain Education --      Exclude from Growth Chart --    No data found.  Updated Vital Signs BP 117/71 (BP Location: Right Arm)   Pulse 84   Temp 98.2 F (36.8 C) (Oral)   Resp 18   LMP 08/19/2011   SpO2 97%   Visual Acuity Right Eye Distance:   Left Eye Distance:   Bilateral Distance:    Right Eye Near:   Left Eye Near:    Bilateral Near:     Physical Exam Vitals and nursing note reviewed.  Constitutional:      Appearance: Normal appearance. She is not ill-appearing.  HENT:     Head: Atraumatic.     Mouth/Throat:     Mouth: Mucous membranes are moist.  Eyes:     Extraocular Movements: Extraocular movements intact.     Pupils: Pupils are equal, round, and reactive to light.     Comments: Left conjunctiva erythematous, injected, drainage and crusting present.  Very slight injection to the right conjunctiva  Cardiovascular:     Rate and Rhythm: Normal rate.  Pulmonary:     Effort: Pulmonary effort is normal.  Musculoskeletal:        General: Normal range of motion.     Cervical back:  Normal range of motion and neck supple.  Skin:    General: Skin is warm and dry.     Comments: Smooth raised flesh-colored papular lesions to the side of her finger  Neurological:     Mental Status: She is alert and oriented to person,  place, and time.  Psychiatric:        Mood and Affect: Mood normal.        Thought Content: Thought content normal.        Judgment: Judgment normal.      UC Treatments / Results  Labs (all labs ordered are listed, but only abnormal results are displayed) Labs Reviewed - No data to display  EKG   Radiology No results found.  Procedures Procedures (including critical care time)  Medications Ordered in UC Medications - No data to display  Initial Impression / Assessment and Plan / UC Course  I have reviewed the triage vital signs and the nursing notes.  Pertinent labs & imaging results that were available during my care of the patient were reviewed by me and considered in my medical decision making (see chart for details).     Will treat for possible conjunctivitis with Polytrim  drops, warm compresses, good handwashing.  Regarding the skin lesions that she shows up on exam, possibly flat warts versus calluses.  Treat with Compound W, continue to monitor and return for worsening symptoms.  Final Clinical Impressions(s) / UC Diagnoses   Final diagnoses:  Acute bacterial conjunctivitis of left eye  Skin lesion     Discharge Instructions      With the areas on your finger, try the Compound W gel in case this is an early warty place.  This may also represent a callus.  Follow-up for worsening or unresolving symptoms    ED Prescriptions     Medication Sig Dispense Auth. Provider   trimethoprim -polymyxin b  (POLYTRIM ) ophthalmic solution Place 1 drop into the left eye every 6 (six) hours. 10 mL Stuart Vernell Norris, [place]      PDMP not reviewed this encounter.   Stuart Vernell Norris, PA-C 09/20/23 1644

## 2023-10-08 ENCOUNTER — Emergency Department (HOSPITAL_COMMUNITY)

## 2023-10-08 ENCOUNTER — Encounter (HOSPITAL_COMMUNITY): Payer: Self-pay | Admitting: Emergency Medicine

## 2023-10-08 ENCOUNTER — Other Ambulatory Visit: Payer: Self-pay

## 2023-10-08 ENCOUNTER — Emergency Department (HOSPITAL_COMMUNITY)
Admission: EM | Admit: 2023-10-08 | Discharge: 2023-10-08 | Disposition: A | Discharge status: Home / Self Care | Attending: Emergency Medicine | Admitting: Emergency Medicine

## 2023-10-08 DIAGNOSIS — Y92410 Unspecified street and highway as the place of occurrence of the external cause: Secondary | ICD-10-CM | POA: Diagnosis not present

## 2023-10-08 DIAGNOSIS — M791 Myalgia, unspecified site: Secondary | ICD-10-CM | POA: Insufficient documentation

## 2023-10-08 DIAGNOSIS — M7918 Myalgia, other site: Secondary | ICD-10-CM

## 2023-10-08 DIAGNOSIS — M542 Cervicalgia: Secondary | ICD-10-CM | POA: Diagnosis present

## 2023-10-08 MED ORDER — METAXALONE 800 MG PO TABS
800.0000 mg | ORAL_TABLET | Freq: Three times a day (TID) | ORAL | 0 refills | Status: AC
Start: 2023-10-08 — End: ?

## 2023-10-08 NOTE — ED Notes (Signed)
EDPA Provider at bedside. 
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
## 2023-10-08 NOTE — ED Notes (Signed)
 Patient transported to CT
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
